# Patient Record
Sex: Female | Born: 1950 | Race: White | Hispanic: No | Marital: Married | Smoking: Former smoker
Health system: Southern US, Community
[De-identification: ages and names within clinical notes are randomized; demographics above are authoritative.]

## PROBLEM LIST (undated history)

## (undated) DIAGNOSIS — T753XXA Motion sickness, initial encounter: Secondary | ICD-10-CM

## (undated) DIAGNOSIS — F32A Depression, unspecified: Secondary | ICD-10-CM

## (undated) DIAGNOSIS — E079 Disorder of thyroid, unspecified: Secondary | ICD-10-CM

## (undated) DIAGNOSIS — F419 Anxiety disorder, unspecified: Secondary | ICD-10-CM

## (undated) DIAGNOSIS — K219 Gastro-esophageal reflux disease without esophagitis: Secondary | ICD-10-CM

## (undated) DIAGNOSIS — F329 Major depressive disorder, single episode, unspecified: Secondary | ICD-10-CM

## (undated) HISTORY — DX: Anxiety disorder, unspecified: F41.9

## (undated) HISTORY — DX: Major depressive disorder, single episode, unspecified: F32.9

## (undated) HISTORY — PX: COLONOSCOPY: SHX174

## (undated) HISTORY — PX: OTHER SURGICAL HISTORY: SHX169

## (undated) HISTORY — PX: VAGINAL HYSTERECTOMY: SUR661

## (undated) HISTORY — PX: BREAST BIOPSY: SHX20

## (undated) HISTORY — PX: ABSCESS DRAINAGE: SHX1119

## (undated) HISTORY — DX: Gastro-esophageal reflux disease without esophagitis: K21.9

## (undated) HISTORY — DX: Depression, unspecified: F32.A

## (undated) HISTORY — DX: Disorder of thyroid, unspecified: E07.9

## (undated) HISTORY — PX: BREAST SURGERY: SHX581

---

## 1976-02-03 HISTORY — PX: THYROID LOBECTOMY: SHX420

## 2004-03-06 ENCOUNTER — Ambulatory Visit: Payer: Self-pay | Admitting: Family Medicine

## 2004-06-24 ENCOUNTER — Ambulatory Visit: Payer: Self-pay | Admitting: Internal Medicine

## 2004-07-04 ENCOUNTER — Ambulatory Visit: Payer: Self-pay | Admitting: Internal Medicine

## 2006-04-09 DIAGNOSIS — K5190 Ulcerative colitis, unspecified, without complications: Secondary | ICD-10-CM | POA: Insufficient documentation

## 2006-04-09 DIAGNOSIS — E010 Iodine-deficiency related diffuse (endemic) goiter: Secondary | ICD-10-CM | POA: Insufficient documentation

## 2006-04-09 DIAGNOSIS — N951 Menopausal and female climacteric states: Secondary | ICD-10-CM | POA: Insufficient documentation

## 2006-04-28 ENCOUNTER — Ambulatory Visit: Payer: Self-pay | Admitting: Family Medicine

## 2006-05-04 ENCOUNTER — Ambulatory Visit: Payer: Self-pay | Admitting: Family Medicine

## 2006-08-27 ENCOUNTER — Ambulatory Visit: Payer: Self-pay | Admitting: Obstetrics and Gynecology

## 2006-09-03 ENCOUNTER — Ambulatory Visit: Payer: Self-pay | Admitting: Obstetrics and Gynecology

## 2008-02-03 HISTORY — PX: THYROIDECTOMY SUBSTERNAL: SUR1359

## 2008-04-09 ENCOUNTER — Encounter: Payer: Self-pay | Admitting: Family Medicine

## 2008-04-13 ENCOUNTER — Ambulatory Visit: Payer: Self-pay | Admitting: Otolaryngology

## 2008-05-03 ENCOUNTER — Encounter: Payer: Self-pay | Admitting: Family Medicine

## 2008-07-05 ENCOUNTER — Ambulatory Visit: Payer: Self-pay | Admitting: Otolaryngology

## 2008-08-14 ENCOUNTER — Inpatient Hospital Stay: Payer: Self-pay | Admitting: Endocrinology

## 2009-07-11 ENCOUNTER — Encounter (INDEPENDENT_AMBULATORY_CARE_PROVIDER_SITE_OTHER): Payer: Self-pay | Admitting: *Deleted

## 2009-10-09 ENCOUNTER — Inpatient Hospital Stay: Payer: Self-pay | Admitting: Endocrinology

## 2010-03-04 NOTE — Letter (Signed)
Summary: Colonoscopy Letter  [facility]  [address] [place], [place] [address]   Phone: [phone]  Fax: [phone]      July 11, 2009 MRN: [id number]   [facility] [address] [place], [place]  [address]   Dear Ms. Devito,   According to your medical record, it is time for you to schedule a Colonoscopy. The American Cancer Society recommends this procedure as a method to detect early colon cancer. Patients with a family history of colon cancer, or a personal history of colon polyps or inflammatory bowel disease are at increased risk.  This letter has beeen generated based on the recommendations made at the time of your procedure. If you feel that in your particular situation this may no longer apply, please contact our office.  Please call our office at [phone] to schedule this appointment or to update your records at your earliest convenience.  Thank you for cooperating with [place] to provide you with the very best care possible.   Sincerely,    Iva Boop, M.D.  [facility] Gastro[facility] [phone]

## 2010-07-01 ENCOUNTER — Encounter: Payer: Self-pay | Admitting: Internal Medicine

## 2010-07-01 ENCOUNTER — Ambulatory Visit (AMBULATORY_SURGERY_CENTER): Payer: BC Managed Care – PPO | Admitting: *Deleted

## 2010-07-01 VITALS — Ht 69.75 in | Wt 219.0 lb

## 2010-07-01 DIAGNOSIS — Z86010 Personal history of colonic polyps: Secondary | ICD-10-CM

## 2010-07-01 MED ORDER — PEG-KCL-NACL-NASULF-NA ASC-C 100 G PO SOLR: ORAL | Status: DC

## 2010-07-15 ENCOUNTER — Other Ambulatory Visit: Payer: Self-pay | Admitting: Internal Medicine

## 2010-07-17 ENCOUNTER — Encounter: Payer: Self-pay | Admitting: Internal Medicine

## 2010-07-17 ENCOUNTER — Ambulatory Visit (AMBULATORY_SURGERY_CENTER): Payer: BC Managed Care – PPO | Admitting: Internal Medicine

## 2010-07-17 VITALS — BP 120/62 | HR 99 | Temp 98.7°F | Resp 18 | Ht 69.0 in | Wt 219.0 lb

## 2010-07-17 DIAGNOSIS — Z1211 Encounter for screening for malignant neoplasm of colon: Secondary | ICD-10-CM

## 2010-07-17 DIAGNOSIS — Z86010 Personal history of colonic polyps: Secondary | ICD-10-CM

## 2010-07-17 MED ORDER — SODIUM CHLORIDE 0.9 % IV SOLN: 500.0000 mL | INTRAVENOUS | Status: AC

## 2010-07-17 NOTE — Progress Notes (Signed)
No complaints on discharge.MAW  Pt assisted to the restroom.  She stayed in there on her own.  I pointed out the call bell red cord to pull if needed.  MAW

## 2010-07-17 NOTE — Patient Instructions (Signed)
Please follow the discharge instructions on the blue and green papers today.  See colonoscopy finding on the [employer] report (picture page).  Please call if any questions or concerns.

## 2010-07-18 ENCOUNTER — Telehealth: Payer: Self-pay | Admitting: *Deleted

## 2010-07-18 NOTE — Telephone Encounter (Signed)

## 2010-10-14 ENCOUNTER — Ambulatory Visit: Payer: Self-pay | Admitting: Family Medicine

## 2010-11-06 ENCOUNTER — Ambulatory Visit: Payer: Self-pay | Admitting: Family Medicine

## 2010-12-01 ENCOUNTER — Ambulatory Visit: Payer: Self-pay | Admitting: General Surgery

## 2010-12-03 LAB — PATHOLOGY REPORT

## 2010-12-04 ENCOUNTER — Ambulatory Visit: Payer: Self-pay | Admitting: General Surgery

## 2012-01-25 ENCOUNTER — Emergency Department: Payer: Self-pay | Admitting: Emergency Medicine

## 2012-02-01 ENCOUNTER — Emergency Department: Payer: Self-pay | Admitting: Emergency Medicine

## 2012-02-11 ENCOUNTER — Ambulatory Visit: Payer: Self-pay | Admitting: Family Medicine

## 2012-02-15 ENCOUNTER — Emergency Department: Payer: Self-pay | Admitting: Emergency Medicine

## 2012-09-06 ENCOUNTER — Ambulatory Visit: Payer: Self-pay | Admitting: Family Medicine

## 2012-10-04 ENCOUNTER — Ambulatory Visit: Payer: Self-pay | Admitting: Family Medicine

## 2012-12-01 ENCOUNTER — Ambulatory Visit: Payer: Self-pay | Admitting: Family Medicine

## 2013-02-27 ENCOUNTER — Emergency Department: Payer: Self-pay | Admitting: Emergency Medicine

## 2013-02-27 LAB — COMPREHENSIVE METABOLIC PANEL
ALT: 34 U/L (ref 12–78)
AST: 24 U/L (ref 15–37)
Albumin: 4.2 g/dL (ref 3.4–5.0)
Alkaline Phosphatase: 109 U/L
Anion Gap: 2 — ABNORMAL LOW (ref 7–16)
BUN: 15 mg/dL (ref 7–18)
Bilirubin,Total: 0.4 mg/dL (ref 0.2–1.0)
CALCIUM: 9.7 mg/dL (ref 8.5–10.1)
CHLORIDE: 106 mmol/L (ref 98–107)
Co2: 32 mmol/L (ref 21–32)
Creatinine: 0.82 mg/dL (ref 0.60–1.30)
EGFR (African American): >60
Glucose: 97 mg/dL (ref 65–99)
OSMOLALITY: 280 (ref 275–301)
Potassium: 3.8 mmol/L (ref 3.5–5.1)
SODIUM: 140 mmol/L (ref 136–145)
TOTAL PROTEIN: 7.9 g/dL (ref 6.4–8.2)

## 2013-02-27 LAB — CBC WITH DIFFERENTIAL/PLATELET
BASOS ABS: 0 10*3/uL (ref 0.0–0.1)
Basophil %: 0.6 %
Eosinophil #: 0.1 10*3/uL (ref 0.0–0.7)
Eosinophil %: 1.7 %
HCT: 41.5 % (ref 35.0–47.0)
HGB: 13.9 g/dL (ref 12.0–16.0)
LYMPHS PCT: 35.2 %
Lymphocyte #: 2.4 10*3/uL (ref 1.0–3.6)
MCH: 30.1 pg (ref 26.0–34.0)
MCHC: 33.5 g/dL (ref 32.0–36.0)
MCV: 90 fL (ref 80–100)
MONO ABS: 0.6 x10 3/mm (ref 0.2–0.9)
MONOS PCT: 8.8 %
NEUTROS PCT: 53.7 %
Neutrophil #: 3.6 10*3/uL (ref 1.4–6.5)
PLATELETS: 343 10*3/uL (ref 150–440)
RBC: 4.63 10*6/uL (ref 3.80–5.20)
RDW: 13.5 % (ref 11.5–14.5)
WBC: 6.8 10*3/uL (ref 3.6–11.0)

## 2013-02-27 LAB — CK TOTAL AND CKMB (NOT AT ARMC)
CK, Total: 55 U/L (ref 21–215)
CK-MB: 0.6 ng/mL (ref 0.5–3.6)

## 2013-02-27 LAB — TROPONIN I: Troponin-I: <0.02 ng/mL

## 2013-02-27 LAB — LIPASE, BLOOD: Lipase: 92 U/L (ref 73–393)

## 2013-02-28 LAB — TROPONIN I

## 2013-05-09 IMAGING — MG MAM DGTL  ADD VW  BIL SCR
2 series · 5 of 5 positions shown · non-contrast
Comparison: none

REASON FOR EXAM: AV BIL LT MASS RT ASYMMETRY
COMMENTS:

[Series 4167: R ML · right · 2 of 2 slices shown]
[im 1/2]
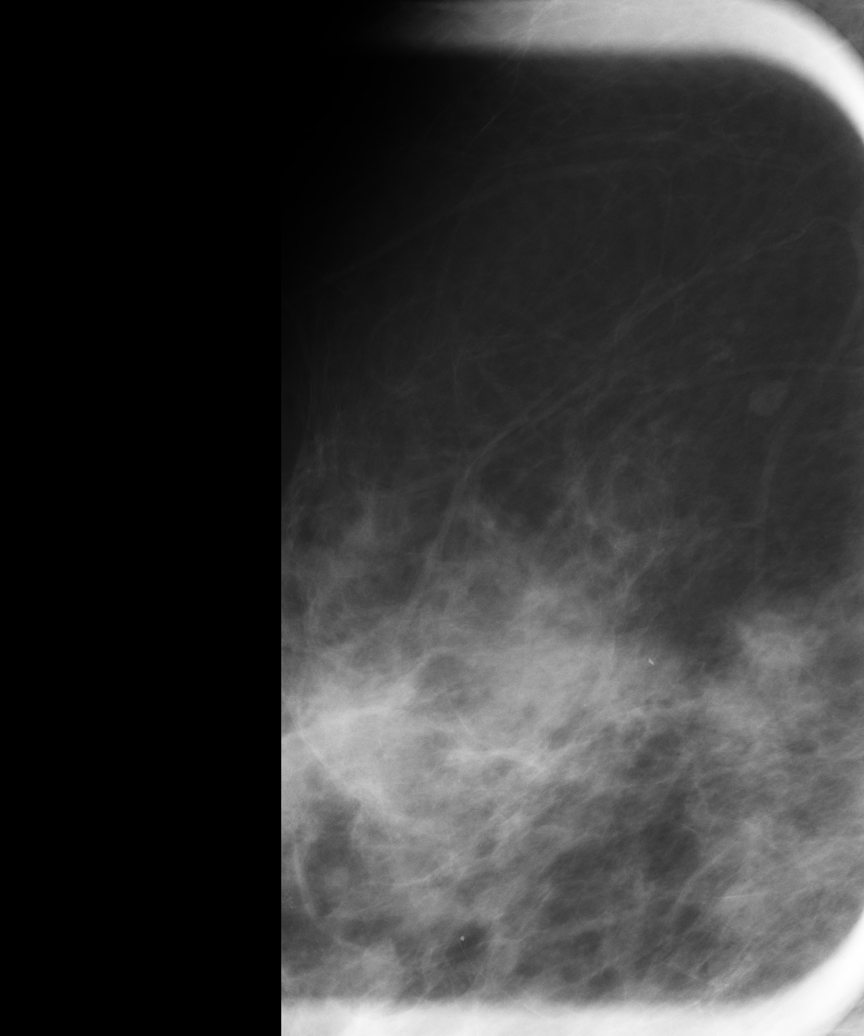
[im 2/2]
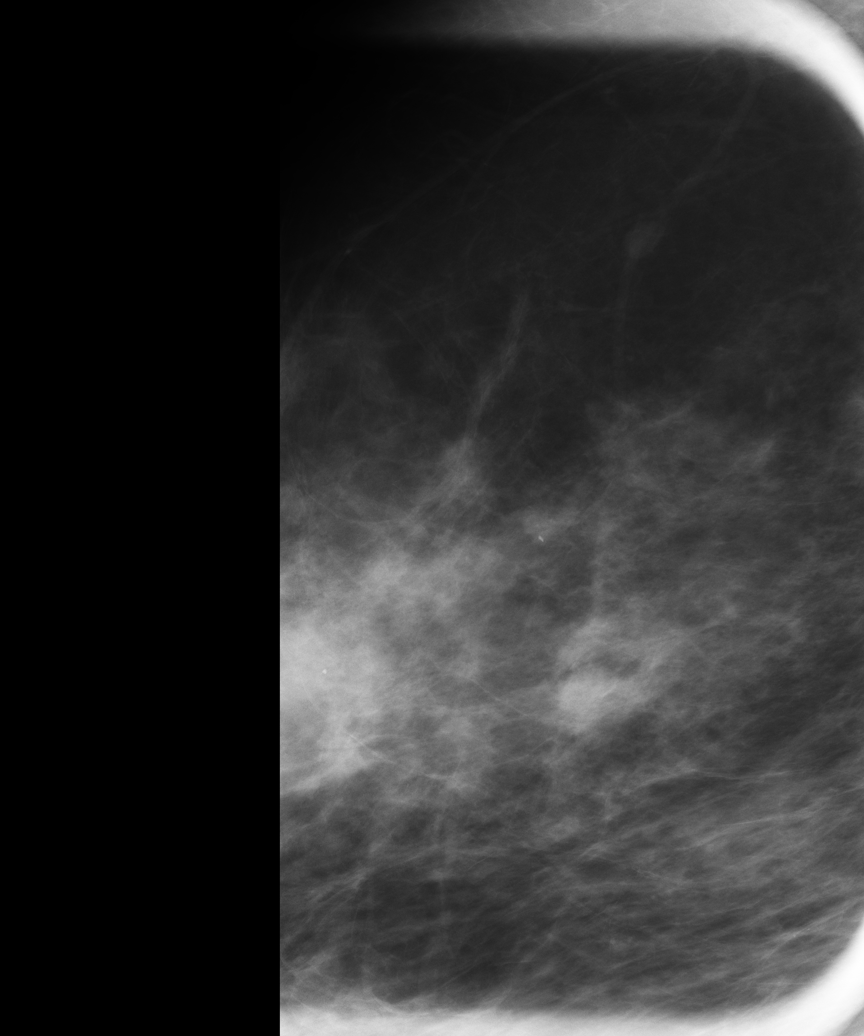

[Series 4170: R CC · right · 3 of 3 slices shown]
[im 1/3]
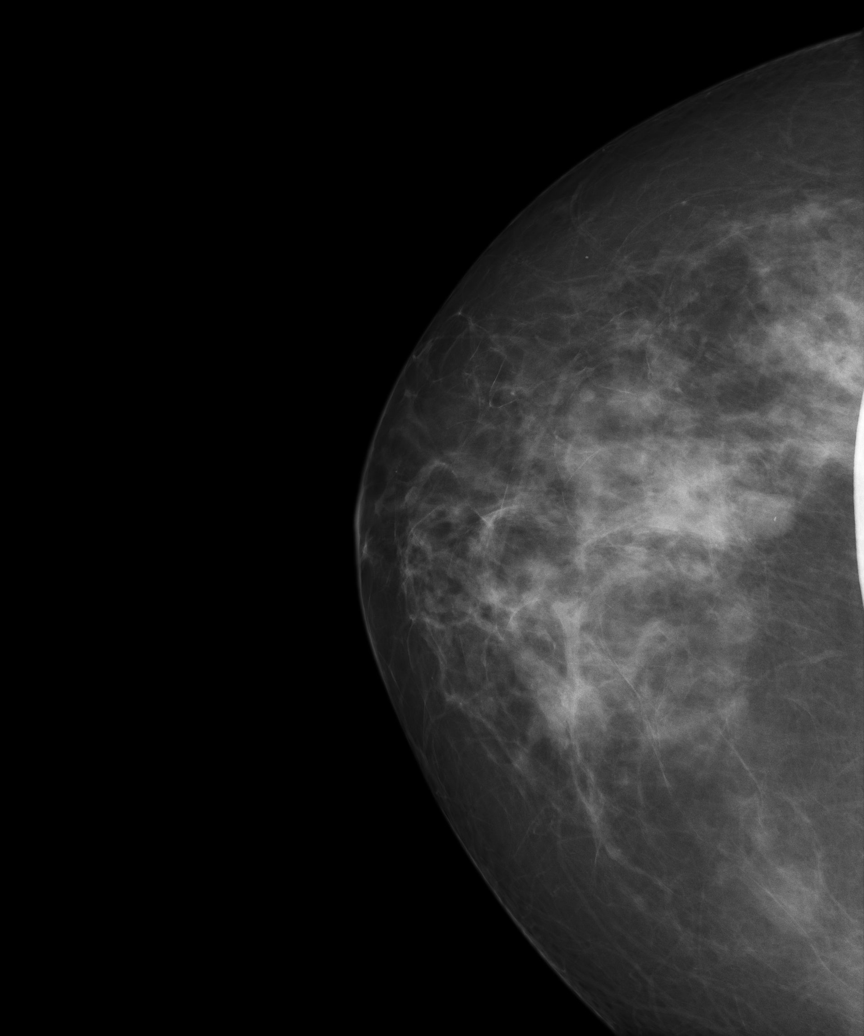
[im 2/3]
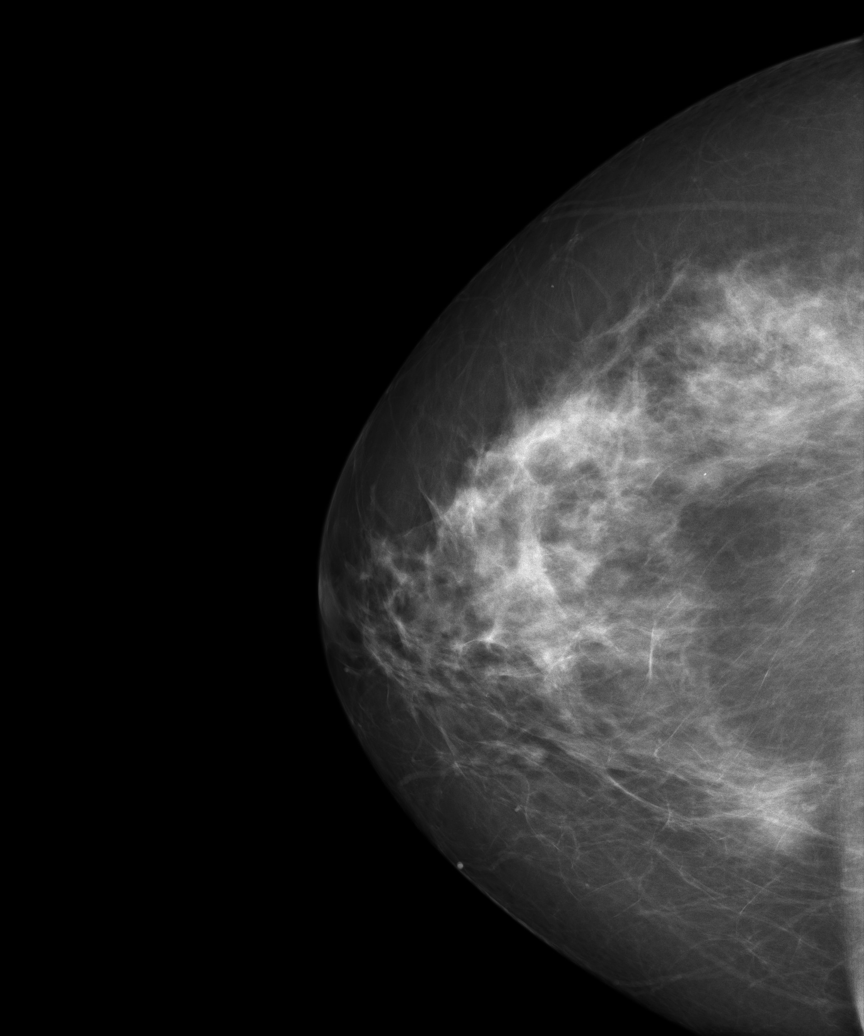
[im 3/3]
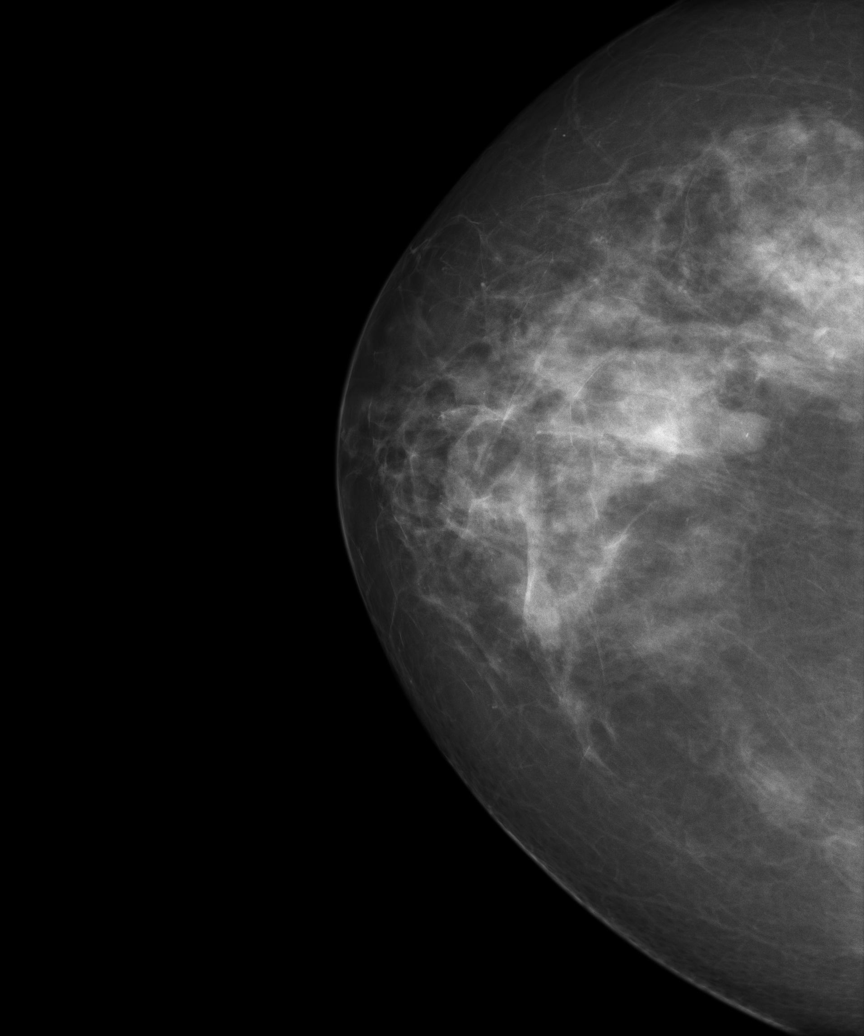

[5 of 5 positions shown; findings below may reference images not displayed]

PROCEDURE:     MAM - MAM DGTL  ADD VW  BIL SCR  - November 06, 2010 [DATE]

RESULT:     The patient returned for additional imaging of an 11 mm mass in
the upper outer quadrant of the left breast. It measures approximately 18 mm
and is located approximately 11 to 12 cm from the nipple at approximately
[DATE]. Spot compression views show the margins to be smooth. No associated
microcalcifications are seen. Targeted left breast ultrasound was performed.
The mass is not identified sonographically even after evaluation was
performed by three separate technologists. Mammographically, this appears to
be a new mass as compared to only prior available view of the left breast
that is dated March 06, 2004. Since a cystic mass would be easily
identified, the finding is felt to represent a solid mass. This could
represent a loculated fat lobule or possibly a benign circumscribed area of
dense fibroglandular tissue. The etiology, however, remains uncertain and in
my opinion histologic evaluation would be required to definitely exclude
malignancy. The finding is thought to be likely accessible by stereotactic
core biopsy if such is selected.

The asymmetry observed in the right breast, is again seen and there is an
apparent corresponding mass observed superiorly in the breast at
approximately 11 o'clock. There is a single central calcification associated
with the mass. Targeted ultrasound was performed and no corresponding
sonographic abnormality is identified. The nature of this mass, therefore,
is indeterminate and percutaneous core biopsy is recommended.
IMPRESSION: 1. There is mass in the upper outer quadrant of the left breast that is
visualized mammographically but not sonographically. Biopsy is recommended
for further evaluation.
2. There is a 9.5 mm mass in the superior portion of the right breast at
approximately [DATE] and which contains a single central calcification. This
mass is located at approximately [DATE], approximately 9.0 cm from the
nipple. This finding also is not identified sonographically and, therefore,
is likely solid. Biopsy of this focus is also recommended.
3. As noted in 1.and 2.above, there are suspicious masses in each breast for
which biopsy is recommended.
4. BIRADS: Category 4A: Suspicios abnormalities.
  ( Likelihood of malignancy at either site is estimated to be up to 10%.)

A negative mammogram report does not preclude biopsy or other evaluation of
a clinically palpable or otherwise suspicious mass or lesion.  Breast cancer
may not be detected by mammography in up to 10% of cases.

## 2013-06-06 DIAGNOSIS — E039 Hypothyroidism, unspecified: Secondary | ICD-10-CM | POA: Insufficient documentation

## 2013-06-06 DIAGNOSIS — F329 Major depressive disorder, single episode, unspecified: Secondary | ICD-10-CM | POA: Insufficient documentation

## 2014-02-27 DIAGNOSIS — C73 Malignant neoplasm of thyroid gland: Secondary | ICD-10-CM | POA: Insufficient documentation

## 2014-06-13 ENCOUNTER — Encounter: Payer: Self-pay | Admitting: Internal Medicine

## 2014-09-07 ENCOUNTER — Telehealth: Payer: Self-pay | Admitting: Family Medicine

## 2014-09-07 NOTE — Telephone Encounter (Signed)
Pt wants to know if you will prescribe the lidocaine patch 5 % .  For joint and muscle pain.  She tried one of someone else and it helped her pain.  She uses [address].

## 2014-09-07 NOTE — Telephone Encounter (Signed)
Patient reports that she will hold off on the lidocaine patch.

## 2014-09-07 NOTE — Telephone Encounter (Signed)
Can try to send in rx. Insurance usually does not pay for it, especially without ov for diagnosis. Usually need PA. Is several hundred dollar a month. Please see if patient still wants to call in rx. Thanks.

## 2014-09-07 NOTE — Telephone Encounter (Signed)
Spoke with pt regarding the note below, pt stated that it hurts on her left hip and buttocks from yesterday being in the car, she also stated that she has a hx of osteoarthritis. Her last ov was on 05/14/2014.

## 2014-09-29 ENCOUNTER — Other Ambulatory Visit: Payer: Self-pay | Admitting: Family Medicine

## 2014-09-29 DIAGNOSIS — M1990 Unspecified osteoarthritis, unspecified site: Secondary | ICD-10-CM

## 2014-10-01 DIAGNOSIS — M1990 Unspecified osteoarthritis, unspecified site: Secondary | ICD-10-CM | POA: Insufficient documentation

## 2014-10-01 NOTE — Telephone Encounter (Signed)
Last OV 05/2014  (Saw Tawanna Sat)  Thanks,   -Mickel Baas

## 2014-10-11 ENCOUNTER — Other Ambulatory Visit: Payer: Self-pay | Admitting: Family Medicine

## 2014-10-11 DIAGNOSIS — K219 Gastro-esophageal reflux disease without esophagitis: Secondary | ICD-10-CM

## 2014-10-12 NOTE — Telephone Encounter (Signed)
Last ov was on 05/14/2014. Last time this prescription was refilled was on 04/13/2014.  Thanks,

## 2014-10-22 ENCOUNTER — Encounter: Payer: Self-pay | Admitting: Family Medicine

## 2014-10-22 ENCOUNTER — Ambulatory Visit (INDEPENDENT_AMBULATORY_CARE_PROVIDER_SITE_OTHER): Payer: 59 | Admitting: Family Medicine

## 2014-10-22 VITALS — BP 120/70 | HR 96 | Temp 98.1°F | Resp 16 | Ht >= 80 in | Wt 183.0 lb

## 2014-10-22 DIAGNOSIS — S61459A Open bite of unspecified hand, initial encounter: Secondary | ICD-10-CM | POA: Insufficient documentation

## 2014-10-22 DIAGNOSIS — L989 Disorder of the skin and subcutaneous tissue, unspecified: Secondary | ICD-10-CM | POA: Diagnosis not present

## 2014-10-22 DIAGNOSIS — Z23 Encounter for immunization: Secondary | ICD-10-CM

## 2014-10-22 DIAGNOSIS — K529 Noninfective gastroenteritis and colitis, unspecified: Secondary | ICD-10-CM | POA: Insufficient documentation

## 2014-10-22 DIAGNOSIS — Z8701 Personal history of pneumonia (recurrent): Secondary | ICD-10-CM | POA: Insufficient documentation

## 2014-10-22 DIAGNOSIS — C73 Malignant neoplasm of thyroid gland: Secondary | ICD-10-CM | POA: Insufficient documentation

## 2014-10-22 DIAGNOSIS — J309 Allergic rhinitis, unspecified: Secondary | ICD-10-CM | POA: Insufficient documentation

## 2014-10-22 DIAGNOSIS — M25511 Pain in right shoulder: Secondary | ICD-10-CM | POA: Diagnosis not present

## 2014-10-22 DIAGNOSIS — M25519 Pain in unspecified shoulder: Secondary | ICD-10-CM | POA: Insufficient documentation

## 2014-10-22 DIAGNOSIS — R52 Pain, unspecified: Secondary | ICD-10-CM | POA: Insufficient documentation

## 2014-10-22 DIAGNOSIS — R413 Other amnesia: Secondary | ICD-10-CM | POA: Diagnosis not present

## 2014-10-22 DIAGNOSIS — H60549 Acute eczematoid otitis externa, unspecified ear: Secondary | ICD-10-CM | POA: Insufficient documentation

## 2014-10-22 DIAGNOSIS — H6980 Other specified disorders of Eustachian tube, unspecified ear: Secondary | ICD-10-CM | POA: Insufficient documentation

## 2014-10-22 NOTE — Progress Notes (Signed)
Patient ID: Jamie Baxter, female   DOB: 30-Oct-1950, 64 y.o.   MRN: [id number]        Patient: Jamie Baxter Female    DOB: Feb 27, 1950   64 y.o.   MRN: [id number] Visit Date: 10/22/2014  Today's Provider: Margarita Rana, MD   Chief Complaint  Patient presents with  . Hand Pain   Subjective:    HPI   Lump on right hand  Patient reports that she has noticed a lump on her right hand. Patient denies pain, or change is size. Patient reports that movement of wrist makes it a little uncomfortable. Patient reports that she had thumb surgery on 12/05/2013 and has noticed the lump since then.  Had surgery about at year ago and needs referral.   Joint/Muscle Pain:  Patient reports that she is having muscle pain in her right arm around her deltoid and forearm. Patient reports that this has being going on since the spring. Patient denies injury to arm and does not think it is related to her wrist pain. Patient reports that she has not tried any OTC medications for her muscle pain. Not the joint. Is right handed. No trauma.    Memory loss: Patient reports that she is needing a referral to see Dr. Fernande Bras at [facility] neurological.  Patient reports that her physiatrist is recommending she get a CT scan of her head for memory loss and refeprral as noted. Memory has been worsening since hospitalization.   ------------------------------------------------------------------------------------       Allergies  Allergen Reactions  . Amantadine Other (See Comments)    disorientation  . Benztropine   . Geodon  [Ziprasidone Hcl] Other (See Comments)    disorientation  . Sulfa Antibiotics    Previous Medications   ARIPIPRAZOLE (ABILIFY) 2 MG TABLET    Take 2 mg by mouth at bedtime.     DESVENLAFAXINE (PRISTIQ) 100 MG 24 HR TABLET    Take 100 mg by mouth daily.     DEXILANT 60 MG CAPSULE    TAKE ONE CAPSULE BY MOUTH ONCE DAILY   DIAZEPAM (VALIUM) 5 MG TABLET    Take 5 mg by mouth 2 (two) times  daily. Takes 1 in the morning and 2 at bedtime    ETODOLAC (LODINE) 400 MG TABLET    TAKE ONE TABLET BY MOUTH TWICE DAILY AS NEEDED (STOP MOBIC AND ALEVE)   FLUTICASONE (FLONASE) 50 MCG/ACT NASAL SPRAY       LAMOTRIGINE (LAMICTAL) 150 MG TABLET       LORATADINE (CLARITIN REDITABS) 10 MG DISSOLVABLE TABLET    Take by mouth.   SEROQUEL XR 50 MG TB24 24 HR TABLET       THYROID (ARMOUR) 180 MG TABLET    Take 180 mg by mouth daily.     TRAZODONE (DESYREL) 50 MG TABLET    Take by mouth.   VIIBRYD 40 MG TABS        Review of Systems  Social History  Substance Use Topics  . Smoking status: Former Research scientist (life sciences)  . Smokeless tobacco: Never Used  . Alcohol Use: No   Objective:   BP 120/70 mmHg  Pulse 96  Temp(Src) 98.1 F (36.7 C) (Oral)  Resp 16  Ht 8' (2.438 m)  Wt 183 lb (83.008 kg)  BMI 13.97 kg/m2  SpO2 97%  Physical Exam  Constitutional: She is oriented to person, place, and time. She appears well-developed and well-nourished.  Musculoskeletal: Normal range of motion. She exhibits tenderness.  Tender  in right biceps and deltoid. Strength intact.  Also with large cyst type structure, mildly tender below right MCP  joint.    Neurological: She is alert and oriented to person, place, and time.       Assessment & Plan:     1. Need for influenza vaccination Given today.  - Flu Vaccine QUAD 36+ mos IM  2. Memory loss Worsening. Will refer per her psychiatrists request.  - Ambulatory referral to Neurology  3. Hand lesion Worsening. Will refer back to her surgeon.  - Ambulatory referral to Orthopedic Surgery  4. Right shoulder pain New problem.  Worsening. Will refer to evaluate and treat.  Already on anti-inflammatory.   - Ambulatory referral to [place], MD  [facility]

## 2014-12-03 ENCOUNTER — Ambulatory Visit (INDEPENDENT_AMBULATORY_CARE_PROVIDER_SITE_OTHER): Payer: 59 | Admitting: Neurology

## 2014-12-03 ENCOUNTER — Encounter: Payer: Self-pay | Admitting: Neurology

## 2014-12-03 VITALS — BP 150/82 | HR 92 | Resp 20 | Ht 70.0 in | Wt 180.0 lb

## 2014-12-03 DIAGNOSIS — G4711 Idiopathic hypersomnia with long sleep time: Secondary | ICD-10-CM | POA: Diagnosis not present

## 2014-12-03 DIAGNOSIS — G4754 Parasomnia in conditions classified elsewhere: Secondary | ICD-10-CM

## 2014-12-03 NOTE — Progress Notes (Signed)
SLEEP MEDICINE CLINIC   Provider:  Larey Seat, M D  Referring Provider: Margarita Rana, MD Primary Care Physician:  Margarita Rana, MD  Chief Complaint  Patient presents with  . New Patient (Initial Visit)    memory, rm 11, with sister    HPI:  Jamie Baxter is a 64 y.o. female , seen here as a referral from Dr. Venia Minks for memory concerns.    Chief complaint according to patient : " I think my words don't come easily and I feel anxious about it "  About a year ago the patient was hospitalized for anxiety and depression. She has noted difficulties with word finding and word generation but also with the process of understanding and deciphering verbal messages. She finds it easier to read and extract the meaning than to lessen. There may be an auditory processing difficulty. She is easily in tears, and she feels fatigued.  The patient was tested today aced on these complaints with a [place] cognitive assessment test. She scored 30 out of 30 points, her F word fluency was 23, she was well able to perform visual spatial tasks executive tasks, naming, recall memory abstraction, language attention and orientation. Listening to Mrs. Blasingame, she seems to feel easily fatigable as well as distractible. She used the word  forgetfulness with these qualifiers. She has gotten lost driving, she has been sleepy, and she stopped working. 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  How likely are you to doze in the following situations: 0 = not likely, 1 = slight chance, 2 = moderate chance, 3 = high chance  Sitting and Reading? 3 Watching Television? 1 Sitting inactive in a public place (theater or meeting)? 0 Lying down in the afternoon when circumstances permit? 3 Sitting and talking to someone?1 Sitting quietly after lunch without alcohol?3 In a car, while stopped for a few  minutes in traffic? 0 As a passenger in a car for an hour without a break? 1  Total =12   Sleep habits are as follows: The patient has been using sleep aids for several months.  Often she fell asleep in her recliner before going to bed. Her usual bedtime is around 9:30 PM, and this medication she can initiate sleep with an an hour to 90 minutes. She reports she had no trouble falling asleep until her hospitalization. May 2015. Once she is asleep , and often restless- she gets up again in the middle of the night and takes Seroquel and trazodone. Sleep talking is not reported.  Overall sleep time is about 8-9 hours of sleep . She is married, but sleeps alone.  She has woken herself snoring. Bedroom is cool, quiet and dark.  Some mornings she sets the alarm, ususually she is awake by 7 AM. Sleep medical history and family sleep history: sister with insomnia. Sleeps 3 hours nightly.  Social history:  Married, here with sister.  No ETOH use and no nicotine . Mother has hypersomnia   Review of Systems: Out of a complete 14 system review, the patient complains of only the following symptoms, and all other reviewed systems are negative.   Epworth score 12 , Fatigue severity score n/a   , depression score see below.    Social History   Social History  . Marital Status: Married    Spouse Name: N/A  . Number of Children: N/A  . Years  of Education: N/A   Occupational History  . Not on file.   Social History Main Topics  . Smoking status: Former Research scientist (life sciences)  . Smokeless tobacco: Never Used  . Alcohol Use: No  . Drug Use: No  . Sexual Activity: Not on file   Other Topics Concern  . Not on file   Social History Narrative    History reviewed. No pertinent family history.  Past Medical History  Diagnosis Date  . Depression   . Thyroid disease   . GERD (gastroesophageal reflux disease)     Past Surgical History  Procedure Laterality Date  . Thyroid lobectomy  1978    goiter  .  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 Vaginal hysterectomy      fiboid tumors  . Colonoscopy  2006, 07/17/10    Normal (except melanosis), ? hx polyps in 1990's - [place]    Current Outpatient Prescriptions  Medication Sig Dispense Refill  . desvenlafaxine (PRISTIQ) 100 MG 24 hr tablet Take 100 mg by mouth daily.      Marland Kitchen DEXILANT 60 MG capsule TAKE ONE CAPSULE BY MOUTH ONCE DAILY 30 capsule 4  . etodolac (LODINE) 400 MG tablet TAKE ONE TABLET BY MOUTH TWICE DAILY AS NEEDED (STOP MOBIC AND ALEVE) 60 tablet 3  . fluticasone (FLONASE) 50 MCG/ACT nasal spray   1  . lamoTRIgine (LAMICTAL) 150 MG tablet   2  . loratadine (CLARITIN REDITABS) 10 MG dissolvable tablet Take by mouth.    . SEROQUEL XR 50 MG TB24 24 hr tablet   9  . thyroid (ARMOUR) 180 MG tablet Take 180 mg by mouth daily.      . traZODone (DESYREL) 50 MG tablet Take by mouth.    Marland Kitchen VIIBRYD 40 MG TABS   10   Current Facility-Administered Medications  Medication Dose Route Frequency Provider Last Rate Last Dose  . 0.9 %  sodium chloride infusion  500 mL Intravenous Continuous Gatha Mayer, MD        Allergies as of 12/03/2014 - Review Complete 12/03/2014  Allergen Reaction Noted  . Amantadine Other (See Comments) 10/22/2014  . Benztropine  10/22/2014  . Geodon  [ziprasidone hcl] Other (See Comments) 10/22/2014  . Sulfa antibiotics  07/17/2010    Vitals: BP 150/82 mmHg  Pulse 92  Resp 20  Ht 5\' 10"  (1.778 m)  Wt 180 lb (81.647 kg)  BMI 25.83 kg/m2 Last Weight:  Wt Readings from Last 1 Encounters:  12/03/14 180 lb (81.647 kg)   KYH:CWCB mass index is 25.83 kg/(m^2).     Last Height:   Ht Readings from Last 1 Encounters:  12/03/14 5\' 10"  (1.778 m)    Physical exam:  General: The patient is awake, alert and appears not in acute distress. The patient is well groomed. Head: Normocephalic, atraumatic. Neck is supple. Mallampati 3,  neck circumference:  15.5 . Nasal airflow ,  Retrognathia is not seen.  Cardiovascular:  Regular rate and rhythm , without  murmurs or carotid bruit, and without distended neck veins. Respiratory: Lungs are clear to auscultation. Skin:  Without evidence of edema, or rash Trunk: BMI is normal . The patient's posture is erect  Neurologic exam : The patient is awake and alert, oriented to place and time.   Memory subjective  described as impaired, [place]   MOCA: Montreal Cognitive Assessment  12/03/2014  Visuospatial/ Executive (0/5) 5  Naming (0/3) 3  Attention: Read list of digits (0/2) 2  Attention: Read list of letters (0/1) 1  Attention: Serial 7 subtraction starting at 100 (0/3) 3  Language: Repeat phrase (0/2) 2  Language : Fluency (0/1) 1  Abstraction (0/2) 2  Delayed Recall (0/5) 5  Orientation (0/6) 6  Total 30  Adjusted Score (based on education) 30   MMSE:No flowsheet data found.     Attention span & concentration ability appears normal.  Speech is fluent,  Without  dysarthria, dysphonia or aphasia.  Mood and affect are appropriate.  Cranial nerves: Pupils are equal and briskly reactive to light. Funduscopic exam without evidence of pallor or edema. Extraocular movements  in vertical and horizontal planes intact and without nystagmus. Visual fields by finger perimetry are intact. Hearing to finger rub intact. Facial sensation intact to fine touch. Facial motor strength is symmetric and tongue and uvula move midline. Shoulder shrug was symmetrical.   Motor exam:  Normal tone, muscle bulk and symmetric strength in all extremities. Sensory:  Fine touch, pinprick and vibration were tested in all extremities.  Proprioception tested in the upper extremities was normal. Coordination: Rapid alternating movements in the fingers/hands was normal. Finger-to-nose maneuver  normal without evidence of ataxia, dysmetria or tremor. Gait and station: Patient walks without assistive device and is able  unassisted to climb up to the exam table. Strength within normal limits.  Stance is stable and normal.  Toe and heel stand were tested .Tandem gait is unfragmented. . Romberg testing is  negative.  Deep tendon reflexes: in the  upper and lower extremities are symmetric and intact. Babinski maneuver response is downgoing.  The patient was advised of the nature of the diagnosed sleep disorder , the treatment options and risks for general a health and wellness arising from not treating the condition.  I spent more than 45 minutes of face to face time with the patient. Greater than 50% of time was spent in counseling and coordination of care. We have discussed the diagnosis and differential and I answered the patient's questions.     Assessment:  After physical and neurologic examination, review of laboratory studies,  Personal review of imaging studies, reports of other /same  Imaging studies ,  Results of polysomnography/ neurophysiology testing and pre-existing records as far as provided in visit., my assessment is   1) insomnia at sleep onset with normal sleep duration. Onset with the dismissal from work. Traumatic event.  Psychological stressors and habituation of sleep aids have caused chronic insomnia   2) given the patient's gradual history of evolving insomnia and the corresponding degree of fatigue and daytime soreness, achiness malaise I think it would be best to streamline her medication treatment. It may also help to you some biofeedback and meditation techniques to allow her to initiate sleep. Sleep hygiene will play a big role as well. I suggest that Dr. Toy Care will increase the Seroquel 200 or even 150 mg nightly as the main sleep inducer. Seroquel also reduces the REM sleep activity at night so nightmares or disturbing vivid dreams would not be as likely to interrupt her sleep.   3)  Pseudo memory loss ; Underlying condition is anxiety and depression. MOCA 30-30 points.    Plan:   Treatment plan and additional workup :  The patient also reports that she is snoring and sometimes wakes up with gasping or a feeling of shortness of air, she has scary and vivid dreams also this change after hospitalization and medication changes. She  does not report any restless legs. To rule out an organic component to her sleep disorder I will order a polysomnography. She does not report morning headaches nor sleep attacks or headaches waking her from sleep. We will not need to perform a couple Percell Miller but it would be best to have a close eye on hypoxemia. She is not able to wean of several REM Supressant medications. NO MSLT to follow.   PSG and SPLIT ,  Letter to Dr Toy Care, increase seroquel and reduce benzodiazepine.    Asencion Partridge Akima Slaugh MD  12/03/2014   CC: Margarita Rana, Md [address] [place], [place] [address]

## 2014-12-03 NOTE — Patient Instructions (Signed)
The patient also reports that she is snoring and sometimes wakes up with gasping or a feeling of shortness of air, she has scary and vivid dreams also this change after hospitalization and medication changes. She does not report any restless legs. To rule out an organic component to her sleep disorder I will order a polysomnography. She does not report morning headaches nor sleep attacks or headaches waking her from sleep. We will not need to perform a couple Percell Miller but it would be best to have a close eye on hypoxemia. She is not able to wean of several REM Supressant medications. NO MSLT to follow.   PSG and SPLIT ,  Letter to Dr Toy Care, increase seroquel and reduce benzodiazepine.  Sleep Apnea Sleep apnea is disorder that affects a person's sleep. A person with sleep apnea has abnormal pauses in their breathing when they sleep. It is hard for them to get a good sleep. This makes a person tired during the day. It also can lead to other physical problems. There are three types of sleep apnea. One type is when breathing stops for a short time because your airway is blocked (obstructive sleep apnea). Another type is when the brain sometimes fails to give the normal signal to breathe to the muscles that control your breathing (central sleep apnea). The third type is a combination of the other two types. HOME CARE  Do not sleep on your back. Try to sleep on your side.  Take all medicine as told by your doctor.  Avoid alcohol, calming medicines (sedatives), and depressant drugs.  Try to lose weight if you are overweight. Talk to your doctor about a healthy weight goal. Your doctor may have you use a device that helps to open your airway. It can help you get the air that you need. It is called a positive airway pressure (PAP) device. There are three types of PAP devices:  Continuous positive airway pressure (CPAP) device.  Nasal expiratory positive airway pressure (EPAP) device.  Bilevel positive  airway pressure (BPAP) device. MAKE SURE YOU:  Understand these instructions.  Will watch your condition.  Will get help right away if you are not doing well or get worse.   This information is not intended to replace advice given to you by your health care provider. Make sure you discuss any questions you have with your health care provider.   Document Released: 10/29/2007 Document Revised: 02/09/2014 Document Reviewed: 05/23/2011 Elsevier Interactive Patient Education [employer].

## 2014-12-25 ENCOUNTER — Other Ambulatory Visit: Payer: Self-pay | Admitting: Obstetrics and Gynecology

## 2014-12-25 DIAGNOSIS — Z1231 Encounter for screening mammogram for malignant neoplasm of breast: Secondary | ICD-10-CM

## 2014-12-25 DIAGNOSIS — Z13820 Encounter for screening for osteoporosis: Secondary | ICD-10-CM

## 2014-12-31 ENCOUNTER — Telehealth: Payer: Self-pay | Admitting: Family Medicine

## 2014-12-31 MED ORDER — PANTOPRAZOLE SODIUM 40 MG PO TBEC: 40.0000 mg | DELAYED_RELEASE_TABLET | Freq: Every day | ORAL | Status: DC

## 2014-12-31 NOTE — Telephone Encounter (Signed)
Pt contacted office for refill request on the following medications:  Pt is requesting a different Rx other than DEXILANT 60 MG capsule.  Pt states this is $80.00 for a 30 day supply.  [place]  HX:[id number]

## 2015-01-01 ENCOUNTER — Telehealth: Payer: Self-pay | Admitting: Neurology

## 2015-01-01 NOTE — Telephone Encounter (Signed)
[employer] denied Split sleep study.  Can I get an order for HST?

## 2015-01-08 ENCOUNTER — Encounter (INDEPENDENT_AMBULATORY_CARE_PROVIDER_SITE_OTHER): Payer: 59 | Admitting: Neurology

## 2015-01-08 DIAGNOSIS — G4710 Hypersomnia, unspecified: Secondary | ICD-10-CM | POA: Diagnosis not present

## 2015-01-15 ENCOUNTER — Telehealth: Payer: Self-pay

## 2015-01-15 DIAGNOSIS — R0902 Hypoxemia: Secondary | ICD-10-CM

## 2015-01-15 DIAGNOSIS — G4733 Obstructive sleep apnea (adult) (pediatric): Secondary | ICD-10-CM

## 2015-01-15 NOTE — Telephone Encounter (Signed)
Called pt to discuss her sleep study results, no answer, left a message asking her to call me back.

## 2015-01-16 ENCOUNTER — Telehealth: Payer: Self-pay | Admitting: Neurology

## 2015-01-16 NOTE — Telephone Encounter (Signed)
CPAP titration denied please order auto pap

## 2015-01-16 NOTE — Telephone Encounter (Signed)
Spoke to pt regarding sleep study results. I advised her that her stud revealed hypoxemia with mild osa and treatment is advised. I advised her that PAP therapy is indicated because dental devices do not treat hypoxemia. Dr. Brett Fairy recommends proceeding with a titration study. Pt is agreeable to proceeding with a titration study as long as her insurance will cover it. I advised her that I would send the order to the sleep lab and they would be calling her to schedule it if her insurance will pay for it. Pt verbalized understanding.

## 2015-01-16 NOTE — Telephone Encounter (Signed)
Patient is returning a call. °

## 2015-01-17 ENCOUNTER — Telehealth: Payer: Self-pay | Admitting: Neurology

## 2015-01-17 DIAGNOSIS — G4710 Hypersomnia, unspecified: Secondary | ICD-10-CM

## 2015-01-17 DIAGNOSIS — R0683 Snoring: Secondary | ICD-10-CM

## 2015-01-17 NOTE — Telephone Encounter (Signed)
Called to advise pt that her CPAP titration was denied. Her insurance has recommended her to start an auto CPAP. I was going to explain this to the pt and offer her an appt with Dr. Brett Fairy to discuss this before starting. Pt's husband answered the phone and I asked him to have the pt call me back.

## 2015-01-17 NOTE — Telephone Encounter (Signed)
HST already performed, no need to perform another.

## 2015-01-17 NOTE — Telephone Encounter (Signed)
Ins denied split sleep study. Can I get an order for HST?

## 2015-01-21 NOTE — Addendum Note (Signed)
Addended by: Lester Muenster A on: 01/21/2015 04:10 PM   Modules accepted: Orders

## 2015-01-21 NOTE — Telephone Encounter (Signed)
Spoke to pt and advised her that her cpap titration was denied. Pt says she thought this was the case. I explained that her insurance recommended an auto cpap. I explained that Dr. Brett Fairy would like her to try this auto cpap for 30-60 days and bring her in for an appt to discuss the results of an auto cpap trial. Pt states that she cannot afford this at this time. She does not want to try an auto cpap. She says she "can't afford any of it". She also refused a f/u appt with Dr. Brett Fairy. I advised her that if she changed her mind to please call us. Pt verbalized understanding.

## 2015-01-28 ENCOUNTER — Other Ambulatory Visit: Payer: Self-pay | Admitting: Family Medicine

## 2015-01-28 DIAGNOSIS — M1990 Unspecified osteoarthritis, unspecified site: Secondary | ICD-10-CM

## 2015-02-18 ENCOUNTER — Telehealth: Payer: Self-pay | Admitting: Family Medicine

## 2015-02-18 NOTE — Telephone Encounter (Signed)
Pt called wanting to let you know that she can use the dexilant if her pharmacy sends for a refill.  Any questions please call her at  [phone]  Thanks Con Memos

## 2015-02-18 NOTE — Telephone Encounter (Signed)
LMTCB 02/18/2015  Thanks,   -Mickel Baas

## 2015-03-04 ENCOUNTER — Ambulatory Visit (INDEPENDENT_AMBULATORY_CARE_PROVIDER_SITE_OTHER): Payer: Self-pay | Admitting: Family Medicine

## 2015-03-04 ENCOUNTER — Ambulatory Visit
Admission: RE | Admit: 2015-03-04 | Discharge: 2015-03-04 | Disposition: A | Discharge status: Home / Self Care | Payer: BLUE CROSS/BLUE SHIELD | Source: Ambulatory Visit | Attending: Family Medicine | Admitting: Family Medicine

## 2015-03-04 ENCOUNTER — Encounter: Payer: Self-pay | Admitting: Family Medicine

## 2015-03-04 VITALS — BP 118/78 | HR 90 | Temp 98.4°F | Resp 16 | Wt 190.6 lb

## 2015-03-04 DIAGNOSIS — R05 Cough: Secondary | ICD-10-CM | POA: Diagnosis present

## 2015-03-04 DIAGNOSIS — J180 Bronchopneumonia, unspecified organism: Secondary | ICD-10-CM

## 2015-03-04 MED ORDER — AMOXICILLIN-POT CLAVULANATE 875-125 MG PO TABS: 1.0000 | ORAL_TABLET | Freq: Two times a day (BID) | ORAL | Status: DC

## 2015-03-04 MED ORDER — HYDROCODONE-HOMATROPINE 5-1.5 MG/5ML PO SYRP: 5.0000 mL | ORAL_SOLUTION | Freq: Three times a day (TID) | ORAL | Status: DC | PRN

## 2015-03-04 NOTE — Progress Notes (Signed)
Patient ID: Jamie Baxter, female   DOB: 10-29-1950, 65 y.o.   MRN: [id number]   Patient: Jamie Baxter Female    DOB: 1950-10-11   65 y.o.   MRN: [id number] Visit Date: 03/04/2015  Today's Provider: Vernie Murders, PA   Chief Complaint  Patient presents with  . URI   Subjective:    URI  This is a new problem. The current episode started 1 to 4 weeks ago. The problem has been gradually worsening. There has been no fever. Associated symptoms include congestion, coughing, diarrhea and wheezing. Pertinent negatives include no sinus pain, sneezing or sore throat. Associated symptoms comments: Cough is ticklish and worse at night when she lies down.. Treatments tried: cough drops/lozenges. The treatment provided no relief.   Patient Active Problem List   Diagnosis Date Noted  . Allergic rhinitis 10/22/2014  . Body aches 10/22/2014  . Open bite of hand 10/22/2014  . 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 Dysfunction of eustachian tube 10/22/2014  . Gastroenteritis, non-infectious 10/22/2014  . H/O recurrent pneumonia 10/22/2014  . 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 Memory loss 10/22/2014  . Hand lesion 10/22/2014  . Shoulder pain 10/22/2014  . Osteoarthritis 10/01/2014  . Cancer of thyroid ([place]) 02/27/2014  . Malignant neoplasm of thyroid gland ([place]) 02/27/2014  . Adult hypothyroidism 06/06/2013  . 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 Major depressive disorder ([place]) 06/06/2013  . 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 Climacteric 04/09/2006  . Esophagitis, reflux 04/09/2006  . Colitis gravis ([place]) 04/09/2006  . Ulcerative colitis ([place]) 04/09/2006   Past Surgical History  Procedure Laterality Date  . Thyroid lobectomy  1978    goiter  . 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 Vaginal hysterectomy      fiboid tumors  . Colonoscopy  2006, 07/17/10    Normal (except melanosis), ? hx polyps in 1990's - [place]   . Breast surgery      breast biopsy   Family History  Problem Relation Age of Onset  . Hypertension Mother   . GER disease Father   . Rheum arthritis Sister   . Healthy Sister      Previous Medications   DESVENLAFAXINE (PRISTIQ) 100 MG 24 HR TABLET    Take 100 mg by mouth daily.     ETODOLAC (LODINE) 400 MG TABLET    TAKE ONE TABLET BY MOUTH TWICE DAILY AS NEEDED *STOP MOBIC AND ALEVE*   FLUTICASONE (FLONASE) 50 MCG/ACT NASAL SPRAY       LAMOTRIGINE (LAMICTAL) 150 MG TABLET       LORATADINE (CLARITIN REDITABS) 10 MG DISSOLVABLE TABLET    Take by mouth.   PANTOPRAZOLE (PROTONIX) 40 MG TABLET    Take 1 tablet (40 mg total) by mouth daily.   SEROQUEL XR 50 MG TB24 24 HR TABLET       THYROID (ARMOUR) 180 MG TABLET    Take 180 mg by mouth daily.     TRAZODONE (DESYREL) 50 MG TABLET    Take by mouth.   VIIBRYD 40 MG TABS       Allergies  Allergen Reactions  . Amantadine Other (See Comments)    disorientation  . Benztropine   . Geodon  [Ziprasidone Hcl] Other (See Comments)    disorientation  . Sulfa Antibiotics     Review of Systems  Constitutional: Negative.   HENT: Positive for congestion. Negative for  sneezing and sore throat.   Eyes: Negative.   Respiratory: Positive for cough and wheezing.   Cardiovascular: Negative.   Gastrointestinal: Positive for diarrhea.  Endocrine: Negative.   Genitourinary: Negative.   Musculoskeletal: Negative.   Skin: Negative.   Allergic/Immunologic: Negative.   Neurological: Negative.   Hematological: Negative.   Psychiatric/Behavioral: Negative.     Social History  Substance Use Topics  . Smoking status: Former Research scientist (life sciences)  . Smokeless tobacco: Never Used  . Alcohol Use: No   Objective:   BP 118/78 mmHg  Pulse 90  Temp(Src) 98.4 F (36.9 C) (Oral)  Resp 16  Wt 190 lb 9.6 oz (86.456 kg)  SpO2 98%  Physical Exam  Constitutional: She is oriented to person, place, and time. She appears well-developed and well-nourished.  HENT:    Head: Normocephalic.  Right Ear: External ear normal.  Left Ear: External ear normal.  Nose: Nose normal.  Mouth/Throat: Oropharynx is clear and moist.  Slight cobblestone appearance to posterior pharynx  Neck: Normal range of motion. Neck supple.  Cardiovascular: Normal rate, regular rhythm and normal heart sounds.   Pulmonary/Chest: Effort normal.  Bubbly rhonchi/rales in right mid chest.  Abdominal: Soft. Bowel sounds are normal.  Lymphadenopathy:    She has no cervical adenopathy.  Neurological: She is alert and oriented to person, place, and time.  Skin: Skin is warm and dry.      Assessment & Plan:     1. Bronchopneumonia Started having cough and fever sensation (no elevation of temperature documented) over the past 2-3 days. Had cough and congestion last week and felt better for a day. This return of symptoms seems to be worse and can hear bubbly rales/rhonchi with auscultation of right upper chest. Will get CXR and start cough syrup with antibiotic. Encouraged to continue Mucinex and Loratadine with increased fluid intake. Recheck pending x-ray report. - HYDROcodone-homatropine (HYCODAN) 5-1.5 MG/5ML syrup; Take 5 mLs by mouth every 8 (eight) hours as needed for cough.  Dispense: 120 mL; Refill: 0 - DG Chest 2 View - amoxicillin-clavulanate (AUGMENTIN) 875-125 MG tablet; Take 1 tablet by mouth 2 (two) times daily.  Dispense: 20 tablet; Refill: 0

## 2015-03-04 NOTE — Patient Instructions (Signed)

## 2015-03-06 ENCOUNTER — Telehealth: Payer: Self-pay

## 2015-03-06 NOTE — Telephone Encounter (Signed)
LMTCB

## 2015-03-06 NOTE — Telephone Encounter (Signed)
-----   Message from Margo Common, [place] sent at 03/05/2015  5:45 PM EST ----- No evidence of pneumonia on chest x-ray. Finish all the antibiotic and recheck if needed in a week. May need inhalers daily for awhile or referral to a pulmonologist.

## 2015-03-07 NOTE — Telephone Encounter (Signed)
Patient advised as directed below. Patient verbalized understanding.  

## 2015-03-15 ENCOUNTER — Telehealth: Payer: Self-pay | Admitting: Family Medicine

## 2015-03-15 DIAGNOSIS — J180 Bronchopneumonia, unspecified organism: Secondary | ICD-10-CM

## 2015-03-15 MED ORDER — HYDROCODONE-HOMATROPINE 5-1.5 MG/5ML PO SYRP: 5.0000 mL | ORAL_SOLUTION | Freq: Three times a day (TID) | ORAL | Status: DC | PRN

## 2015-03-15 MED ORDER — AMOXICILLIN-POT CLAVULANATE 875-125 MG PO TABS: 1.0000 | ORAL_TABLET | Freq: Two times a day (BID) | ORAL | Status: DC

## 2015-03-15 NOTE — Telephone Encounter (Signed)
Sent in Augmentin and rx for Hydrocodone printed.  Thanks.

## 2015-03-15 NOTE — Telephone Encounter (Signed)
Patient reports she will get her husband to pick up prescription. Advised patient.

## 2015-03-15 NOTE — Telephone Encounter (Signed)
Pt contacted office for refill request on the following medications:  [facility].  CB#[phone]/MW  amoxicillin-clavulanate (AUGMENTIN) 875-125 MG tablet  HYDROcodone-homatropine (HYCODAN) 5-1.5 MG/5ML syrup

## 2015-03-18 ENCOUNTER — Other Ambulatory Visit: Payer: Self-pay

## 2015-03-18 DIAGNOSIS — M1990 Unspecified osteoarthritis, unspecified site: Secondary | ICD-10-CM

## 2015-03-18 MED ORDER — ETODOLAC 400 MG PO TABS: ORAL_TABLET | ORAL | Status: DC

## 2015-04-26 ENCOUNTER — Telehealth: Payer: Self-pay | Admitting: Family Medicine

## 2015-04-26 MED ORDER — OMEPRAZOLE 40 MG PO CPDR: 40.0000 mg | DELAYED_RELEASE_CAPSULE | Freq: Every day | ORAL | Status: DC

## 2015-04-26 NOTE — Telephone Encounter (Signed)
Pt wants to know if you can prescribe her Prilosec instead of dexilant.  Her insurance does not pay for the dexilant.    [facility] [place]  ThanksTeri

## 2015-04-26 NOTE — Telephone Encounter (Signed)
Is this okay? Lorie Cleckley Drozdowski, CMA  

## 2015-08-22 ENCOUNTER — Other Ambulatory Visit: Payer: Self-pay

## 2015-08-22 NOTE — Telephone Encounter (Signed)
Pharmacy requesting refills on medication.

## 2015-08-23 MED ORDER — OMEPRAZOLE 40 MG PO CPDR: 40.0000 mg | DELAYED_RELEASE_CAPSULE | Freq: Every day | ORAL | Status: DC

## 2015-09-29 ENCOUNTER — Other Ambulatory Visit: Payer: Self-pay | Admitting: Family Medicine

## 2015-09-29 DIAGNOSIS — M1990 Unspecified osteoarthritis, unspecified site: Secondary | ICD-10-CM

## 2015-09-30 NOTE — Telephone Encounter (Signed)
Jenni's pt.   Thanks,   -Jecenia Leamer  

## 2015-10-22 ENCOUNTER — Other Ambulatory Visit: Payer: Self-pay | Admitting: Family Medicine

## 2016-06-02 ENCOUNTER — Other Ambulatory Visit: Payer: Self-pay | Admitting: Orthopedic Surgery

## 2016-06-02 DIAGNOSIS — M25511 Pain in right shoulder: Secondary | ICD-10-CM

## 2016-06-03 ENCOUNTER — Ambulatory Visit
Admission: RE | Admit: 2016-06-03 | Discharge: 2016-06-03 | Disposition: A | Discharge status: Home / Self Care | Payer: Medicare HMO | Source: Ambulatory Visit | Attending: Orthopedic Surgery | Admitting: Orthopedic Surgery

## 2016-06-03 DIAGNOSIS — M25511 Pain in right shoulder: Secondary | ICD-10-CM | POA: Diagnosis not present

## 2016-06-03 DIAGNOSIS — M75121 Complete rotator cuff tear or rupture of right shoulder, not specified as traumatic: Secondary | ICD-10-CM | POA: Diagnosis not present

## 2017-03-16 ENCOUNTER — Other Ambulatory Visit: Payer: Self-pay | Admitting: Family

## 2017-03-16 DIAGNOSIS — Z1231 Encounter for screening mammogram for malignant neoplasm of breast: Secondary | ICD-10-CM

## 2017-08-22 ENCOUNTER — Emergency Department
Admission: EM | Admit: 2017-08-22 | Discharge: 2017-08-22 | Disposition: A | Discharge status: Home / Self Care | Payer: Medicare HMO | Attending: Emergency Medicine | Admitting: Emergency Medicine

## 2017-08-22 ENCOUNTER — Other Ambulatory Visit: Payer: Self-pay

## 2017-08-22 DIAGNOSIS — Z79899 Other long term (current) drug therapy: Secondary | ICD-10-CM | POA: Insufficient documentation

## 2017-08-22 DIAGNOSIS — Z85850 Personal history of malignant neoplasm of thyroid: Secondary | ICD-10-CM | POA: Diagnosis not present

## 2017-08-22 DIAGNOSIS — Z87891 Personal history of nicotine dependence: Secondary | ICD-10-CM | POA: Diagnosis not present

## 2017-08-22 DIAGNOSIS — E039 Hypothyroidism, unspecified: Secondary | ICD-10-CM | POA: Insufficient documentation

## 2017-08-22 DIAGNOSIS — Y9389 Activity, other specified: Secondary | ICD-10-CM | POA: Insufficient documentation

## 2017-08-22 DIAGNOSIS — X58XXXA Exposure to other specified factors, initial encounter: Secondary | ICD-10-CM | POA: Insufficient documentation

## 2017-08-22 DIAGNOSIS — T161XXA Foreign body in right ear, initial encounter: Secondary | ICD-10-CM | POA: Diagnosis present

## 2017-08-22 DIAGNOSIS — Y998 Other external cause status: Secondary | ICD-10-CM | POA: Diagnosis not present

## 2017-08-22 DIAGNOSIS — Y929 Unspecified place or not applicable: Secondary | ICD-10-CM | POA: Diagnosis not present

## 2017-08-22 NOTE — ED Notes (Signed)

## 2017-08-22 NOTE — ED Triage Notes (Signed)
Pt states she has a Qtip stuck in her right ear,

## 2017-08-22 NOTE — ED Provider Notes (Signed)
[facility] Emergency Department Provider Note  ____________________________________________  Time seen: Approximately 12:12 PM  I have reviewed the triage vital signs and the nursing notes.   HISTORY  Chief Complaint Foreign Body in Ear    HPI Jamie Baxter is a 67 y.o. female that presents emergency department for evaluation of Q-tip in the right ear since this morning.  No additional injuries.  Past Medical History:  Diagnosis Date  . Depression   . GERD (gastroesophageal reflux disease)   . Thyroid disease     Patient Active Problem List   Diagnosis Date Noted  . Allergic rhinitis 10/22/2014  . Body aches 10/22/2014  . Open bite of hand 10/22/2014  . 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 Dysfunction of eustachian tube 10/22/2014  . Gastroenteritis, non-infectious 10/22/2014  . H/O recurrent pneumonia 10/22/2014  . 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 Memory loss 10/22/2014  . Hand lesion 10/22/2014  . Shoulder pain 10/22/2014  . Osteoarthritis 10/01/2014  . Cancer of thyroid ([place]) 02/27/2014  . Malignant neoplasm of thyroid gland ([place]) 02/27/2014  . Adult hypothyroidism 06/06/2013  . Affective disorder, major 06/06/2013  . Major depressive disorder 06/06/2013  . 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 Climacteric 04/09/2006  . Esophagitis, reflux 04/09/2006  . Colitis gravis ([place]) 04/09/2006  . Ulcerative colitis ([place]) 04/09/2006    Past Surgical History:  Procedure Laterality Date  . BREAST SURGERY     breast biopsy  . COLONOSCOPY  2006, 07/17/10   Normal (except melanosis), ? hx polyps in 1990's - [place]  . THYROID LOBECTOMY  1978   goiter  . THYROIDECTOMY SUBSTERNAL  2010   left side/cancer/radioactive iodine 2 years in a row for Ca cells  . VAGINAL HYSTERECTOMY     fiboid tumors    Prior to Admission medications   Medication Sig Start Date End Date Taking? Authorizing Provider  amoxicillin-clavulanate (AUGMENTIN) 875-125  MG tablet Take 1 tablet by mouth 2 (two) times daily. 03/15/15   Margarita Rana, MD  desvenlafaxine (PRISTIQ) 100 MG 24 hr tablet Take 100 mg by mouth daily.      [provider]  etodolac (LODINE) 400 MG tablet Take 1 tablet (400 mg total) by mouth 2 (two) times daily as needed. TAKE ONE TABLET BY MOUTH TWICE DAILY AS NEEDED *STOP MOBIC AND ALEVE* 09/30/15   Birdie Sons, MD  fluticasone [facility]) 50 MCG/ACT nasal spray  09/01/14   [provider]  HYDROcodone-homatropine (HYCODAN) 5-1.5 MG/5ML syrup Take 5 mLs by mouth every 8 (eight) hours as needed for cough. 03/15/15   Margarita Rana, MD  lamoTRIgine (LAMICTAL) 150 MG tablet  09/29/14   [provider]  loratadine (CLARITIN REDITABS) 10 MG dissolvable tablet Take by mouth. 09/22/13   [provider]  omeprazole (PRILOSEC) 40 MG capsule TAKE 1 CAPSULE BY MOUTH EVERY DAY 10/22/15   Chrismon, Vickki Muff, PA  SEROQUEL XR 50 MG TB24 24 hr tablet  10/12/14   [provider]  thyroid (ARMOUR) 180 MG tablet Take 180 mg by mouth daily.      [provider]  traZODone (DESYREL) 50 MG tablet Take by mouth.    [provider]  VIIBRYD 40 MG TABS  10/09/14   [provider]    Allergies Amantadine; Benztropine; Geodon  [ziprasidone hcl]; and Sulfa antibiotics  Family History  Problem Relation Age of Onset  . GER disease Father   . Rheum arthritis Sister   . Healthy Sister   .  Hypertension Mother     Social History Social History   Tobacco Use  . Smoking status: Former Research scientist (life sciences)  . Smokeless tobacco: Never Used  Substance Use Topics  . Alcohol use: No  . Drug use: No     Review of Systems  Gastrointestinal: No nausea, no vomiting.  Musculoskeletal: Negative for musculoskeletal pain. Skin: Negative for rash, abrasions, lacerations, ecchymosis. Neurological: Negative for headaches   ____________________________________________   PHYSICAL EXAM:  VITAL SIGNS: ED Triage  Vitals [08/22/17 0908]  Enc Vitals Group     BP 133/85     Pulse Rate 92     Resp 16     Temp 98.1 F (36.7 C)     Temp Source Oral     SpO2 98 %     Weight 200 lb (90.7 kg)     Height 5\' 9"  (1.753 m)     Head Circumference      Peak Flow      Pain Score 1     Pain Loc      Pain Edu?      Excl. in [place]?      Constitutional: Alert and oriented. Well appearing and in no acute distress. Eyes: Conjunctivae are normal. PERRL. EOMI. Head: Atraumatic. ENT:      Ears: Cotton qtip in right ear canal.       Nose: No congestion/rhinnorhea.      Mouth/Throat: Mucous membranes are moist.  Neck: No stridor.  Cardiovascular: Good peripheral circulation. Respiratory: Normal respiratory effort without tachypnea or retractions.  Musculoskeletal: Full range of motion to all extremities. No gross deformities appreciated. Neurologic:  Normal speech and language. No gross focal neurologic deficits are appreciated.  Skin:  Skin is warm, dry and intact. No rash noted. Psychiatric: Mood and affect are normal. Speech and behavior are normal. Patient exhibits appropriate insight and judgement.   ____________________________________________   LABS (all labs ordered are listed, but only abnormal results are displayed)  Labs Reviewed - No data to display ____________________________________________  EKG   ____________________________________________  RADIOLOGY   No results found.  ____________________________________________    PROCEDURES  Procedure(s) performed:    .Foreign Body Removal Date/Time: 08/22/2017 12:14 PM Performed by: Laban Emperor, PA-C Authorized by: Laban Emperor, PA-C  Consent: Verbal consent obtained. Risks and benefits: risks, benefits and alternatives were discussed Consent given by: patient Patient understanding: patient states understanding of the procedure being performed Patient identity confirmed: verbally with patient Body area:  ear  Sedation: Patient sedated: no  Patient restrained: no Patient cooperative: yes Localization method: visualized Removal mechanism: alligator forceps Complexity: simple 1 objects recovered. Objects recovered: 1 Post-procedure assessment: foreign body removed Patient tolerance: Patient tolerated the procedure well with no immediate complications      Medications - No data to display   ____________________________________________   INITIAL IMPRESSION / ASSESSMENT AND PLAN / ED COURSE  Pertinent labs & imaging results that were available during my care of the patient were reviewed by me and considered in my medical decision making (see chart for details).  Review of the [place] CSRS was performed in accordance of the [place] prior to dispensing any controlled drugs.     Patient's diagnosis is consistent with foreign body removal. Vital signs and exam are reassuring. Qtip removed without difficulty.  Patient is to follow up with ENT as directed. Patient is given ED precautions to return to the ED for any worsening or new symptoms.     ____________________________________________  FINAL CLINICAL IMPRESSION(S) /  ED DIAGNOSES  Final diagnoses:  Foreign body of right ear, initial encounter      NEW MEDICATIONS STARTED DURING THIS VISIT:  ED Discharge Orders    None          This chart was dictated using voice recognition software/Dragon. Despite best efforts to proofread, errors can occur which can change the meaning. Any change was purely unintentional.    Laban Emperor, PA-C 08/22/17 1215    Clearnce Hasten Randall An, MD 08/22/17 [phone]

## 2018-03-17 ENCOUNTER — Other Ambulatory Visit: Payer: Self-pay | Admitting: Family

## 2018-03-17 DIAGNOSIS — Z1231 Encounter for screening mammogram for malignant neoplasm of breast: Secondary | ICD-10-CM

## 2018-04-05 ENCOUNTER — Ambulatory Visit
Admission: RE | Admit: 2018-04-05 | Discharge: 2018-04-05 | Disposition: A | Discharge status: Home / Self Care | Payer: Medicare Other | Source: Ambulatory Visit | Attending: Family | Admitting: Family

## 2018-04-05 ENCOUNTER — Other Ambulatory Visit: Payer: Self-pay | Admitting: Family

## 2018-04-05 DIAGNOSIS — Z1231 Encounter for screening mammogram for malignant neoplasm of breast: Secondary | ICD-10-CM | POA: Diagnosis not present

## 2018-04-05 DIAGNOSIS — Z13820 Encounter for screening for osteoporosis: Secondary | ICD-10-CM

## 2018-05-23 ENCOUNTER — Other Ambulatory Visit: Payer: Medicare Other

## 2018-07-12 ENCOUNTER — Other Ambulatory Visit: Payer: Self-pay

## 2018-07-12 ENCOUNTER — Encounter: Payer: Medicare Other | Admitting: Psychiatry

## 2018-07-12 ENCOUNTER — Encounter: Payer: Self-pay | Admitting: Psychiatry

## 2018-07-18 ENCOUNTER — Ambulatory Visit
Admission: RE | Admit: 2018-07-18 | Discharge: 2018-07-18 | Disposition: A | Discharge status: Home / Self Care | Payer: Medicare Other | Source: Ambulatory Visit | Attending: Family | Admitting: Family

## 2018-07-18 ENCOUNTER — Other Ambulatory Visit: Payer: Self-pay

## 2018-07-18 DIAGNOSIS — Z13820 Encounter for screening for osteoporosis: Secondary | ICD-10-CM | POA: Insufficient documentation

## 2018-07-18 DIAGNOSIS — M8589 Other specified disorders of bone density and structure, multiple sites: Secondary | ICD-10-CM | POA: Insufficient documentation

## 2018-08-11 ENCOUNTER — Encounter: Payer: Self-pay | Admitting: Psychiatry

## 2018-08-11 ENCOUNTER — Ambulatory Visit (INDEPENDENT_AMBULATORY_CARE_PROVIDER_SITE_OTHER): Payer: Medicare Other | Admitting: Psychiatry

## 2018-08-11 ENCOUNTER — Other Ambulatory Visit: Payer: Self-pay

## 2018-08-11 DIAGNOSIS — F410 Panic disorder [episodic paroxysmal anxiety] without agoraphobia: Secondary | ICD-10-CM | POA: Insufficient documentation

## 2018-08-11 DIAGNOSIS — F331 Major depressive disorder, recurrent, moderate: Secondary | ICD-10-CM | POA: Diagnosis not present

## 2018-08-11 MED ORDER — FLUOXETINE HCL 40 MG PO CAPS: 40.0000 mg | ORAL_CAPSULE | Freq: Every day | ORAL | 0 refills | Status: DC
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
## 2018-09-12 ENCOUNTER — Other Ambulatory Visit: Payer: Self-pay

## 2018-09-12 ENCOUNTER — Encounter: Payer: Self-pay | Admitting: Licensed Clinical Social Worker

## 2018-09-12 ENCOUNTER — Ambulatory Visit (INDEPENDENT_AMBULATORY_CARE_PROVIDER_SITE_OTHER): Payer: Medicare Other | Admitting: Licensed Clinical Social Worker

## 2018-09-12 DIAGNOSIS — F331 Major depressive disorder, recurrent, moderate: Secondary | ICD-10-CM

## 2018-09-12 DIAGNOSIS — F410 Panic disorder [episodic paroxysmal anxiety] without agoraphobia: Secondary | ICD-10-CM
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
## 2018-09-19 ENCOUNTER — Other Ambulatory Visit: Payer: Self-pay

## 2018-09-19 ENCOUNTER — Ambulatory Visit (INDEPENDENT_AMBULATORY_CARE_PROVIDER_SITE_OTHER): Payer: Self-pay | Admitting: Psychiatry

## 2018-09-19 DIAGNOSIS — Z5329 Procedure and treatment not carried out because of patient's decision for other reasons: Secondary | ICD-10-CM

## 2018-09-19 NOTE — Progress Notes (Signed)
No response to call 

## 2018-09-29 ENCOUNTER — Other Ambulatory Visit: Payer: Self-pay | Admitting: Psychiatry

## 2018-09-29 DIAGNOSIS — F410 Panic disorder [episodic paroxysmal anxiety] without agoraphobia: Secondary | ICD-10-CM

## 2018-09-29 DIAGNOSIS — F331 Major depressive disorder, recurrent, moderate: Secondary | ICD-10-CM

## 2018-10-03 ENCOUNTER — Ambulatory Visit: Payer: Medicare Other | Admitting: Licensed Clinical Social Worker

## 2018-10-03 ENCOUNTER — Other Ambulatory Visit: Payer: Self-pay

## 2018-10-11 ENCOUNTER — Encounter: Payer: Self-pay | Admitting: Psychiatry

## 2018-10-11 ENCOUNTER — Other Ambulatory Visit: Payer: Self-pay

## 2018-10-11 ENCOUNTER — Ambulatory Visit (INDEPENDENT_AMBULATORY_CARE_PROVIDER_SITE_OTHER): Payer: Medicare Other | Admitting: Psychiatry

## 2018-10-11 DIAGNOSIS — F331 Major depressive disorder, recurrent, moderate: Secondary | ICD-10-CM | POA: Diagnosis not present

## 2018-10-11 DIAGNOSIS — F410 Panic disorder [episodic paroxysmal anxiety] without agoraphobia: Secondary | ICD-10-CM

## 2018-10-11 MED ORDER — HYDROXYZINE PAMOATE 25 MG PO CAPS: 25.0000 mg | ORAL_CAPSULE | Freq: Every day | ORAL | 1 refills | Status: DC | PRN

## 2018-10-11 MED ORDER — FLUOXETINE HCL 20 MG PO CAPS: 20.0000 mg | ORAL_CAPSULE | Freq: Every day | ORAL | 0 refills | Status: DC

## 2018-10-11 NOTE — Progress Notes (Signed)
Virtual Visit via Video Note  I connected with Jamie Baxter on 10/11/18 at  1:45 PM EDT by a video enabled telemedicine application and verified that I am speaking with the correct person using two identifiers.   I discussed the limitations of evaluation and management by telemedicine and the availability of in person appointments. The patient expressed understanding and agreed to proceed.  I discussed the assessment and treatment plan with the patient. The patient was provided an opportunity to ask questions and all were answered. The patient agreed with the plan and demonstrated an understanding of the instructions.   The patient was advised to call back or seek an in-person evaluation if the symptoms worsen or if the condition fails to improve as anticipated.   [facility] MD OP Progress Note  10/11/2018 4:19 PM Jamie Baxter  MRN:  [id number]  Chief Complaint:  Chief Complaint    Follow-up     HPI: Jamie Baxter is a 68 year old Caucasian female, married, retired, lives in [place], has a history of MDD, panic disorder, osteoarthritis, history of thyroid cancer, GERD was evaluated by telemedicine today.  Patient reports that her mother passed away recently.  She reports she was able to spend some time with her mother and that helps her a lot.  She reports she is taking it day by day and is trying to work  with her therapist Ms. Alden Hipp.  She reports she does have some on and off panic attacks ever since her mother died.  She is on Prozac.  She is interested in dosage increase.  Also discussed adding hydroxyzine as needed for panic attacks.  Patient reports sleep is good.  She denies any suicidality, homicidality or perceptual disturbances.  Patient appeared to be alert, oriented to person place time and situation.   Visit Diagnosis:    ICD-10-CM   1. MDD (major depressive disorder), recurrent episode, moderate (HCC)  F33.1 FLUoxetine (PROZAC) 20 MG capsule    hydrOXYzine (VISTARIL) 25  MG capsule  2. Panic disorder  F41.0 FLUoxetine (PROZAC) 20 MG capsule    hydrOXYzine (VISTARIL) 25 MG capsule    Past Psychiatric History: I have reviewed past psychiatric history from my progress note on 08/11/2018.  Past trials of Pristiq, Prozac, Lamictal, Seroquel.  Past Medical History:  Past Medical History:  Diagnosis Date  . Anxiety   . Depression   . GERD (gastroesophageal reflux disease)   . Thyroid disease     Past Surgical History:  Procedure Laterality Date  . ABSCESS DRAINAGE    . BREAST BIOPSY    . BREAST SURGERY     breast biopsy  . COLONOSCOPY  2006, 07/17/10   Normal (except melanosis), ? hx polyps in 1990's - [place]  . rotator cuff    . THYROID LOBECTOMY  1978   goiter  . THYROIDECTOMY SUBSTERNAL  2010   left side/cancer/radioactive iodine 2 years in a row for Ca cells  . VAGINAL HYSTERECTOMY     fiboid tumors    Family Psychiatric History: I have reviewed family psychiatric history from my progress note on 08/11/2018  Family History:  Family History  Problem Relation Age of Onset  . GER disease Father   . Rheum arthritis Sister   . Healthy Sister   . Hypertension Mother   . Mental illness Neg Hx     Social History: I have reviewed social history from my progress note on 08/11/2018 Social History   Socioeconomic History  . Marital status:  Married    Spouse name: monroe  . Number of children: 1  . Years of education: Not on file  . Highest education level: High school graduate  Occupational History  . Not on file  Social Needs  . Financial resource strain: Not hard at all  . Food insecurity    Worry: Never true    Inability: Never true  . Transportation needs    Medical: No    Non-medical: No  Tobacco Use  . Smoking status: Former Smoker    Types: Cigarettes    Quit date: 02/03/1976    Years since quitting: 42.7  . Smokeless tobacco: Never Used  Substance and Sexual Activity  . Alcohol use: No  . Drug use: No  . Sexual activity: Not  Currently  Lifestyle  . Physical activity    Days per week: 5 days    Minutes per session: 20 min  . Stress: Not at all  Relationships  . Social Herbalist on phone: Not on file    Gets together: Not on file    Attends religious service: More than 4 times per year    Active member of club or organization: No    Attends meetings of clubs or organizations: Never    Relationship status: Married  Other Topics Concern  . Not on file  Social History Narrative  . Not on file    Allergies:  Allergies  Allergen Reactions  . Amantadine Other (See Comments)    disorientation  . Benztropine   . Geodon  [Ziprasidone Hcl] Other (See Comments)    disorientation  . Sulfa Antibiotics     Metabolic Disorder Labs: No results found for: HGBA1C, MPG No results found for: PROLACTIN No results found for: CHOL, TRIG, HDL, CHOLHDL, VLDL, LDLCALC No results found for: TSH  Therapeutic Level Labs: No results found for: LITHIUM No results found for: VALPROATE No components found for:  CBMZ  Current Medications: Current Outpatient Medications  Medication Sig Dispense Refill  . B Complex Vitamins (B COMPLEX PO) Take by mouth.    . Coenzyme Q10 (COQ10 PO) Take by mouth.    . etodolac (LODINE) 400 MG tablet Take by mouth.    Marland Kitchen FLUoxetine (PROZAC) 20 MG capsule Take 1 capsule (20 mg total) by mouth daily. To be combined with 40 mg 90 capsule 0  . FLUoxetine (PROZAC) 40 MG capsule TAKE 1 CAPSULE BY MOUTH  DAILY 90 capsule 3  . fluticasone (FLONASE) 50 MCG/ACT nasal spray   1  . GLUCOSAMINE-CHONDROITIN PO Take by mouth.    . hydrOXYzine (VISTARIL) 25 MG capsule Take 1 capsule (25 mg total) by mouth daily as needed. To be taken 1-3 times a week as needed for severe panic attacks 10 capsule 1  . lamoTRIgine (LAMICTAL) 150 MG tablet   2  . Levothyroxine Sodium (SYNTHROID PO) Take by mouth.    . loratadine (CLARITIN REDITABS) 10 MG dissolvable tablet Take by mouth.    Marland Kitchen MILK THISTLE PO  Take by mouth.    Marland Kitchen omeprazole (PRILOSEC) 40 MG capsule TAKE 1 CAPSULE BY MOUTH EVERY DAY 90 capsule 0  . QUEtiapine Fumarate (SEROQUEL XR) 150 MG 24 hr tablet     . traZODone (DESYREL) 50 MG tablet Take by mouth.    . triamcinolone cream (KENALOG) 0.1 % TRIAMCINOLONE ACETONIDE, 0.1% (External Cream)  1 (one) Cream Cream use as directed for 0 days  Quantity: 30;  Refills: 1   Ordered :12-Jan-2014  Meyer Cory ;  Started 12-Jan-2014 Active    . VITAMIN D PO Take by mouth.    Marland Kitchen VITAMIN E PO Take by mouth.     Current Facility-Administered Medications  Medication Dose Route Frequency Provider Last Rate Last Dose  . 0.9 %  sodium chloride infusion  500 mL Intravenous Continuous Gatha Mayer, MD         Musculoskeletal: Strength & Muscle Tone: UTA Gait & Station: WNL Patient leans: N/A  Psychiatric Specialty Exam: Review of Systems  Psychiatric/Behavioral: The patient is nervous/anxious.   All other systems reviewed and are negative.   There were no vitals taken for this visit.There is no height or weight on file to calculate BMI.  General Appearance: Casual  Eye Contact:  Fair  Speech:  Normal Rate  Volume:  Normal  Mood:  Anxious and Depressed  Affect:  Appropriate  Thought Process:  Goal Directed and Descriptions of Associations: Intact  Orientation:  Full (Time, Place, and Person)  Thought Content: Logical   Suicidal Thoughts:  No  Homicidal Thoughts:  No  Memory:  Immediate;   Fair Recent;   Fair Remote;   Fair  Judgement:  Fair  Insight:  Fair  Psychomotor Activity:  Normal  Concentration:  Concentration: Fair and Attention Span: Fair  Recall:  [employer] of Knowledge: Fair  Language: Fair  Akathisia:  No  Handed:  Right  AIMS (if indicated): Denies tremors, rigidity  Assets:  Communication Skills Desire for Improvement Social Support  ADL's:  Intact  Cognition: WNL  Sleep:  Fair   Screenings: PHQ2-9     Office Visit from 12/03/2014 in  [facility]  PHQ-2 Total Score  2  PHQ-9 Total Score  20       Assessment and Plan: Jamie Baxter is a 68 year old Caucasian female who has a history of MDD, panic attacks, GERD, thyroid cancer in remission, osteoarthritis was evaluated by telemedicine today.  Patient is biologically predisposed given her history of medical problems as well as trauma.  Patient also is currently going through the grief of losing her mother recently.  She will benefit from medication readjustment for her anxiety and depressive symptoms as well as will benefit from psychotherapy sessions.  Plan MDD- some progress Increase Prozac to 60 mg p.o. daily Seroquel XR 150 mg p.o. nightly Lamotrigine 150 mg p.o. twice daily Continue CBT with her therapist Ms. Alden Hipp  Panic disorder- some progress Increase Prozac to 60 mg p.o. daily  Insomnia-stable Trazodone 50 mg-she takes 1 to 3 tablets at bedtime as needed.  Following labs are pending-TSH, lipid panel, hemoglobin A1c, prolactin.  Patient to sign a release to obtain medical records from her oncologist as well as her previous psychiatrist.  Follow-up in clinic in 6 to 8 weeks or sooner if needed.  October 29 at 10:30 AM  I have spent atleast 25 minutes non face to face with patient today. More than 50 % of the time was spent for psychoeducation and supportive psychotherapy and care coordination. This note was generated in part or whole with voice recognition software. Voice recognition is usually quite accurate but there are transcription errors that can and very often do occur. I apologize for any typographical errors that were not detected and corrected.       Ursula Alert, MD 10/11/2018, 4:19 PM

## 2018-11-17 ENCOUNTER — Encounter: Payer: Self-pay | Admitting: Licensed Clinical Social Worker

## 2018-11-17 ENCOUNTER — Other Ambulatory Visit: Payer: Self-pay

## 2018-11-17 ENCOUNTER — Ambulatory Visit (INDEPENDENT_AMBULATORY_CARE_PROVIDER_SITE_OTHER): Payer: Medicare Other | Admitting: Licensed Clinical Social Worker

## 2018-11-17 DIAGNOSIS — F331 Major depressive disorder, recurrent, moderate: Secondary | ICD-10-CM | POA: Diagnosis not present

## 2018-11-17 NOTE — Progress Notes (Signed)
  Virtual Visit via Video Note  I connected with [name] on 11/17/18 at  1:30 PM EDT by a video enabled telemedicine application and verified that I am speaking with the correct person using two identifiers.   I discussed the limitations of evaluation and management by telemedicine and the availability of in person appointments. The patient expressed understanding and agreed to proceed.  I discussed the assessment and treatment plan with the patient. The patient was provided an opportunity to ask questions and all were answered. The patient agreed with the plan and demonstrated an understanding of the instructions.   The patient was advised to call back or seek an in-person evaluation if the symptoms worsen or if the condition fails to improve as anticipated.  I provided 30 minutes of non-face-to-face time during this encounter.   Alden Hipp, LCSW   THERAPIST PROGRESS NOTE  Session Time: 1330  Participation Level: Active  Behavioral Response: CasualAlertAnxious  Type of Therapy: Individual Therapy  Treatment Goals addressed: Anxiety  Interventions: Supportive  Summary: SHALAYAH NICOTERA is a 68 y.o. female who presents with continued symptoms related to her diagnosis. Montia reports doing well since our last session. She apologized for missing an appointment, but noted her mother died during that time. LCSW expressed condolences for Jalaila's loss, and asked how she was handling the loss. Raeah reported her grief has been complicated, but she has been able to manage it. She reports one day she will be happy and the next she will cry for most of the day. LCSW validated Millissa's experience and highlighted how grief is not an individual fit for anyone. We discussed the various ways grief can be experienced, and ways to manage grief when she is feeling overwhelmed. Netty reported her primary stressor at the moment is feeling panic when she is trying to go to sleep. She reports managing this  by doing deep breathing and then is ultimately able to fall asleep. LCSW validated Javon's use of this coping skill, and encouraged her to utilize other mindfulness techniques as well. We discussed what that looked like and how she could best use those skills to manage her anxiety in the moment. Kalli expressed understanding and agreement.   Suicidal/Homicidal: No  Therapist Response: Milika continues to work towards her tx goals but has not yet reached them. We will continue to work on emotional regulation skills and improving distress tolerance moving forward.   Plan: Return again in 4 weeks.  Diagnosis: Axis I: mdd    Axis II: No diagnosis    Alden Hipp, LCSW 11/17/2018

## 2018-11-19 ENCOUNTER — Other Ambulatory Visit: Payer: Self-pay | Admitting: Psychiatry

## 2018-11-19 DIAGNOSIS — F331 Major depressive disorder, recurrent, moderate: Secondary | ICD-10-CM

## 2018-11-19 DIAGNOSIS — F410 Panic disorder [episodic paroxysmal anxiety] without agoraphobia: Secondary | ICD-10-CM

## 2018-12-19 ENCOUNTER — Ambulatory Visit (INDEPENDENT_AMBULATORY_CARE_PROVIDER_SITE_OTHER): Payer: Medicare Other | Admitting: Licensed Clinical Social Worker

## 2018-12-19 ENCOUNTER — Encounter: Payer: Self-pay | Admitting: Licensed Clinical Social Worker

## 2018-12-19 ENCOUNTER — Other Ambulatory Visit: Payer: Self-pay

## 2018-12-19 DIAGNOSIS — F331 Major depressive disorder, recurrent, moderate: Secondary | ICD-10-CM

## 2018-12-19 NOTE — Progress Notes (Signed)
  Virtual Visit via Video Note  I connected with Jamie Baxter on 12/19/18 at  1:30 PM EST by a video enabled telemedicine application and verified that I am speaking with the correct person using two identifiers.   I discussed the limitations of evaluation and management by telemedicine and the availability of in person appointments. The patient expressed understanding and agreed to proceed.    I discussed the assessment and treatment plan with the patient. The patient was provided an opportunity to ask questions and all were answered. The patient agreed with the plan and demonstrated an understanding of the instructions.   The patient was advised to call back or seek an in-person evaluation if the symptoms worsen or if the condition fails to improve as anticipated.  I provided 45 minutes of non-face-to-face time during this encounter.   Alden Hipp, LCSW  THERAPIST PROGRESS NOTE  Session Time: 1330  Participation Level: Active  Behavioral Response: NeatAlertNA  Type of Therapy: Individual Therapy  Treatment Goals addressed: Anxiety  Interventions: Supportive  Summary: Jamie Baxter is a 68 y.o. female who presents with continued symptoms related to her diagnosis. Jamie Baxter reports doing well since our last session. She reports she had one major incident that caused her anxiety to rise. She stated her husband took too many of his muscle relaxers by mistake, said he couldn't breathe, and asked her to call the ambulance. She reported this event made her start thinking about her mother and brother, both of whom died after going to the hospital. LCSW validated that experience and asked her to tell the rest of the story. Jamie Baxter reported after waiting for several hours to be admitted to the ER, her husband stated he felt better and she took him home. LCSW encouraged Jamie Baxter to utilize that experience in the future to remind herself that things will be okay, or will likely be okay when she feels  nervous. Jamie Baxter was able to recognize this as well and expressed agreement. Jamie Baxter reported she was able to utilize CBT skills, such as telling herself what she would tell a friend, to decrease her anxiety symptoms in that moment. LCSW validated use of coping skills, and encouraged Jamie Baxter to continue utilizing them. Jamie Baxter reports other than that incident, she has days where she doesn't feel like doing much and usually feels guilty about that. LCSW validated that idea as well, and encouraged Jamie Baxter to allow herself to listen to her body and what it's telling her in order to feel more comfortable doing nothing at times. Jamie Baxter expressed understanding and agreement with this information as well.   Suicidal/Homicidal: No  Therapist Response: Jamie Baxter continues to work towards her tx goals but has not yet reached them. We will continue to work on emotional regulation skills moving forward, and will continue to work on improving CBT skills to manage anxiety symptoms.   Plan: Return again in 4 weeks.  Diagnosis: Axis I: MDD    Axis II: No diagnosis    Alden Hipp, LCSW 12/19/2018

## 2019-01-18 ENCOUNTER — Ambulatory Visit (INDEPENDENT_AMBULATORY_CARE_PROVIDER_SITE_OTHER): Payer: Medicare Other | Admitting: Licensed Clinical Social Worker

## 2019-01-18 ENCOUNTER — Encounter: Payer: Self-pay | Admitting: Licensed Clinical Social Worker

## 2019-01-18 ENCOUNTER — Other Ambulatory Visit: Payer: Self-pay

## 2019-01-18 DIAGNOSIS — F331 Major depressive disorder, recurrent, moderate: Secondary | ICD-10-CM | POA: Diagnosis not present

## 2019-01-18 NOTE — Progress Notes (Signed)
Virtual Visit via Video Note  I connected with Jamie Baxter on 01/18/19 at  1:30 PM EST by a video enabled telemedicine application and verified that I am speaking with the correct person using two identifiers.   I discussed the limitations of evaluation and management by telemedicine and the availability of in person appointments. The patient expressed understanding and agreed to proceed.  I discussed the assessment and treatment plan with the patient. The patient was provided an opportunity to ask questions and all were answered. The patient agreed with the plan and demonstrated an understanding of the instructions.   The patient was advised to call back or seek an in-person evaluation if the symptoms worsen or if the condition fails to improve as anticipated.  I provided 30  minutes of non-face-to-face time during this encounter.   Alden Hipp, LCSW   THERAPIST PROGRESS NOTE  Session Time: 1330  Participation Level: Active  Behavioral Response: NeatAlertAnxious  Type of Therapy: Individual Therapy  Treatment Goals addressed: Anxiety  Interventions: Supportive  Summary: Jamie Baxter is a 69 y.o. female who presents with continued symptoms related to her diagnosis. Jamie Baxter reports doing well since our last session. Jamie Baxter reports two major events occurred which have caused her anxiety and stress. She reports her sister fell off her porch which caused two stress fractures in her hand. Myeisha reported, "all things considered, I'm not too worried about her. It was just scary." LCSW validated Jamie Baxter's feelings around this situation, and encouraged her to check in on her sister as frequently as she needs to in order to manage her anxiety around the subject, but also utilize CBT skill to manage anxiety in the moment. Jamie Baxter expressed understanding and agreement. Jamie Baxter moved on to discussing her older sister who is currently in the hospital with COVID-19. Jamie Baxter reports, "she was doing better and  she was no longer intubated, but they took her off and had to put her back on." Jamie Baxter expressed feeling anxious about the outcome, but was able to point out a few positives. She reported the doctor calls her sister's daughter daily to provide updates and information on her sister's status. LCSW validated Jamie Baxter's feelings and encouraged her to stay positive. LCSW also encouraged Jamie Baxter to utilize CBT skills to challenge negative thoughts as they come up. Jamie Baxter expressed understanding and agreement. Jamie Baxter reported otherwise, things are going very well in her family and everyone is doing well.   Suicidal/Homicidal: No   Therapist Response: Jamie Baxter continues to work towards her tx goals but has not yet reached them. We will continue to work on improving emotional regulation skills and communication moving forward.   Plan: Return again in 4 weeks.  Diagnosis: Axis I: MDD    Axis II: No diagnosis    Alden Hipp, LCSW 01/18/2019

## 2019-01-30 ENCOUNTER — Encounter: Payer: Self-pay | Admitting: Psychiatry

## 2019-01-30 ENCOUNTER — Other Ambulatory Visit: Payer: Self-pay

## 2019-01-30 ENCOUNTER — Ambulatory Visit (INDEPENDENT_AMBULATORY_CARE_PROVIDER_SITE_OTHER): Payer: Medicare Other | Admitting: Psychiatry

## 2019-01-30 DIAGNOSIS — F331 Major depressive disorder, recurrent, moderate: Secondary | ICD-10-CM

## 2019-01-30 DIAGNOSIS — F410 Panic disorder [episodic paroxysmal anxiety] without agoraphobia: Secondary | ICD-10-CM | POA: Diagnosis not present

## 2019-01-30 DIAGNOSIS — G4700 Insomnia, unspecified: Secondary | ICD-10-CM | POA: Diagnosis not present

## 2019-01-30 MED ORDER — LAMOTRIGINE 150 MG PO TABS: 150.0000 mg | ORAL_TABLET | Freq: Two times a day (BID) | ORAL | 1 refills | Status: DC

## 2019-01-30 MED ORDER — TRAZODONE HCL 50 MG PO TABS: 50.0000 mg | ORAL_TABLET | Freq: Every evening | ORAL | 1 refills | Status: DC | PRN

## 2019-01-30 MED ORDER — FLUOXETINE HCL 40 MG PO CAPS: 40.0000 mg | ORAL_CAPSULE | Freq: Every day | ORAL | 3 refills | Status: DC

## 2019-01-30 MED ORDER — QUETIAPINE FUMARATE ER 150 MG PO TB24: 150.0000 mg | ORAL_TABLET | Freq: Every day | ORAL | 1 refills | Status: DC

## 2019-01-30 MED ORDER — FLUOXETINE HCL 20 MG PO CAPS: 20.0000 mg | ORAL_CAPSULE | Freq: Every day | ORAL | 0 refills | Status: DC

## 2019-01-30 NOTE — Progress Notes (Signed)
Virtual Visit via Video Baxter  I connected with Jamie Baxter on 01/30/19 at 10:45 AM EST by a video enabled telemedicine application and verified that I am speaking with the correct person using two identifiers.   I discussed the limitations of evaluation and management by telemedicine and the availability of in person appointments. The patient expressed understanding and agreed to proceed.     I discussed the assessment and treatment plan with the patient. The patient was provided an opportunity to ask questions and all were answered. The patient agreed with the plan and demonstrated an understanding of the instructions.   The patient was advised to call back or seek an in-person evaluation if the symptoms worsen or if the condition fails to improve as anticipated.   Jamie Baxter  01/30/2019 11:42 AM Jamie Baxter  MRN:  [id number]  Chief Complaint:  Chief Complaint    Follow-up     HPI: Jamie Baxter is a 68 year old Caucasian female, married, retired, lives in [place], has a history of MDD, panic disorder, osteoarthritis, history of thyroid cancer, GERD was evaluated by telemedicine today.  Patient reports her sister is currently admitted to the hospital for COVID-19 infection.  She reports that does make her anxious and sad.  Patient reports she never increased her Prozac dosage as discussed last visit.  She continues to take 40 mg daily.  Initially patient reported she never received prescription which was sent out by writer for the 20 mg however later on when she checked her prescription bottle she reported that she does have the 20 mg at home but she never took it.  Patient reports she would like to go up on her dosage today.  Patient does report some on and off panic attacks however overall she has been coping okay.  Patient denies any suicidality, homicidality or perceptual disturbances.  Patient was asked to get her labs including TSH and metabolic panel as discussed last  visit.  Patient reports she will get it done the first week of January. Visit Diagnosis:    ICD-10-CM   1. MDD (major depressive disorder), recurrent episode, moderate (HCC)  F33.1 FLUoxetine (PROZAC) 20 MG capsule    FLUoxetine (PROZAC) 40 MG capsule    QUEtiapine Fumarate (SEROQUEL XR) 150 MG 24 hr tablet    lamoTRIgine (LAMICTAL) 150 MG tablet    traZODone (DESYREL) 50 MG tablet  2. Panic disorder  F41.0 FLUoxetine (PROZAC) 20 MG capsule    FLUoxetine (PROZAC) 40 MG capsule    Past Psychiatric History: I have reviewed past psychiatric history from my progress Baxter on 08/11/2018.  Past trials of Pristiq, Prozac, Lamictal, Seroquel.  Past Medical History:  Past Medical History:  Diagnosis Date  . Anxiety   . Depression   . GERD (gastroesophageal reflux disease)   . Thyroid disease     Past Surgical History:  Procedure Laterality Date  . ABSCESS DRAINAGE    . BREAST BIOPSY    . BREAST SURGERY     breast biopsy  . COLONOSCOPY  2006, 07/17/10   Normal (except melanosis), ? hx polyps in 1990's - [place]  . rotator cuff    . THYROID LOBECTOMY  1978   goiter  . THYROIDECTOMY SUBSTERNAL  2010   left side/cancer/radioactive iodine 2 years in a row for Ca cells  . VAGINAL HYSTERECTOMY     fiboid tumors    Family Psychiatric History: I have reviewed family psychiatric history from my progress Baxter on 08/11/2018.  Family History:  Family History  Problem Relation Age of Onset  . GER disease Father   . Rheum arthritis Sister   . Healthy Sister   . Hypertension Mother   . Mental illness Neg Hx     Social History: I have reviewed social history from my progress Baxter on 08/11/2018. Social History   Socioeconomic History  . Marital status: Married    Spouse name: monroe  . Number of children: 1  . Years of education: Not on file  . Highest education level: High school graduate  Occupational History  . Not on file  Tobacco Use  . Smoking status: Former Smoker    Types:  Cigarettes    Quit date: 02/03/1976    Years since quitting: 43.0  . Smokeless tobacco: Never Used  Substance and Sexual Activity  . Alcohol use: No  . Drug use: No  . Sexual activity: Not Currently  Other Topics Concern  . Not on file  Social History Narrative  . Not on file   Social Determinants of Health   Financial Resource Strain: Low Risk   . Difficulty of Paying Living Expenses: Not hard at all  Food Insecurity: No Food Insecurity  . Worried About Charity fundraiser in the Last Year: Never true  . Ran Out of Food in the Last Year: Never true  Transportation Needs: No Transportation Needs  . Lack of Transportation (Medical): No  . Lack of Transportation (Non-Medical): No  Physical Activity: Insufficiently Active  . Days of Exercise per Week: 5 days  . Minutes of Exercise per Session: 20 min  Stress: No Stress Concern Present  . Feeling of Stress : Not at all  Social Connections: Unknown  . Frequency of Communication with Friends and Family: Not on file  . Frequency of Social Gatherings with Friends and Family: Not on file  . Attends Religious Services: More than 4 times per year  . Active Member of Clubs or Organizations: No  . Attends Archivist Meetings: Never  . Marital Status: Married    Allergies:  Allergies  Allergen Reactions  . Amantadine Other (See Comments)    disorientation  . Benztropine   . Geodon  [Ziprasidone Hcl] Other (See Comments)    disorientation  . Sulfa Antibiotics     Metabolic Disorder Labs: No results found for: HGBA1C, MPG No results found for: PROLACTIN No results found for: CHOL, TRIG, HDL, CHOLHDL, VLDL, LDLCALC No results found for: TSH  Therapeutic Level Labs: No results found for: LITHIUM No results found for: VALPROATE No components found for:  CBMZ  Current Medications: Current Outpatient Medications  Medication Sig Dispense Refill  . B Complex Vitamins (B COMPLEX PO) Take by mouth.    . Coenzyme Q10  (COQ10 PO) Take by mouth.    . etodolac (LODINE) 400 MG tablet Take by mouth.    Marland Kitchen FLUoxetine (PROZAC) 20 MG capsule Take 1 capsule (20 mg total) by mouth daily. To be combined with 40 mg 90 capsule 0  . FLUoxetine (PROZAC) 40 MG capsule Take 1 capsule (40 mg total) by mouth daily. TO BE COMBINED WITH 20 MG 90 capsule 3  . fluticasone (FLONASE) 50 MCG/ACT nasal spray   1  . GLUCOSAMINE-CHONDROITIN PO Take by mouth.    . hydrOXYzine (VISTARIL) 25 MG capsule TAKE 1 CAP BY MOUTH DAILY AS NEEDED TO BE TAKE 1-3 TIMES A WEEK AS NEEDED FOR SEVERE PANIC ATTACKS 10 capsule 1  . lamoTRIgine (LAMICTAL) 150  MG tablet Take 1 tablet (150 mg total) by mouth 2 (two) times daily. 180 tablet 1  . Levothyroxine Sodium (SYNTHROID PO) Take by mouth.    . loratadine (CLARITIN REDITABS) 10 MG dissolvable tablet Take by mouth.    Marland Kitchen MILK THISTLE PO Take by mouth.    Marland Kitchen omeprazole (PRILOSEC) 40 MG capsule TAKE 1 CAPSULE BY MOUTH EVERY DAY 90 capsule 0  . QUEtiapine Fumarate (SEROQUEL XR) 150 MG 24 hr tablet Take 1 tablet (150 mg total) by mouth at bedtime. 90 tablet 1  . traZODone (DESYREL) 50 MG tablet Take 1-3 tablets (50-150 mg total) by mouth at bedtime as needed for sleep. 270 tablet 1  . triamcinolone cream (KENALOG) 0.1 % TRIAMCINOLONE ACETONIDE, 0.1% (External Cream)  1 (one) Cream Cream use as directed for 0 days  Quantity: 30;  Refills: 1   Ordered :12-Jan-2014  Meyer Cory ;  Started 12-Jan-2014 Active    . VITAMIN D PO Take by mouth.    Marland Kitchen VITAMIN E PO Take by mouth.     Current Facility-Administered Medications  Medication Dose Route Frequency Provider Last Rate Last Admin  . 0.9 %  sodium chloride infusion  500 mL Intravenous Continuous Gatha Mayer, MD         Musculoskeletal: Strength & Muscle Tone: UTA Gait & Station: normal Patient leans: N/A  Psychiatric Specialty Exam: Review of Systems  Psychiatric/Behavioral: Positive for dysphoric mood. Negative for agitation, behavioral  problems, confusion, decreased concentration, hallucinations, self-injury, sleep disturbance and suicidal ideas. The patient is not nervous/anxious and is not hyperactive.   All other systems reviewed and are negative.   There were no vitals taken for this visit.There is no height or weight on file to calculate BMI.  General Appearance: Casual  Eye Contact:  Fair  Speech:  Normal Rate  Volume:  Normal  Mood:  Depressed  Affect:  Congruent  Thought Process:  Goal Directed and Descriptions of Associations: Intact  Orientation:  Full (Time, Place, and Person)  Thought Content: Logical   Suicidal Thoughts:  No  Homicidal Thoughts:  No  Memory:  Immediate;   Fair Recent;   Fair Remote;   Fair  Judgement:  Fair  Insight:  Good  Psychomotor Activity:  Normal  Concentration:  Concentration: Fair and Attention Span: Fair  Recall:  [employer] of Knowledge: Fair  Language: Fair  Akathisia:  No  Handed:  Right  AIMS (if indicated):   Assets:  Communication Skills Desire for Improvement Social Support  ADL's:  Intact  Cognition: WNL  Sleep:  Fair   Screenings: PHQ2-9     Office Visit from 12/03/2014 in [facility]  PHQ-2 Total Score  2  PHQ-9 Total Score  20       Assessment and Plan: Evalyna is a 68 year old Caucasian female who has a history of MDD, panic attacks, GERD, thyroid cancer in remission, osteoarthritis was evaluated by telemedicine today.  Patient is biologically predisposed given her history of medical problems as well as trauma.  Patient is currently going through psychosocial stressors of the death of her mother as well as her sister who is currently struggling with health issues.  Patient will benefit from medication readjustment to address her mood symptoms.  Patient has been noncompliant with the increased dosage of Prozac last visit.  Patient agrees to start taking the higher dosage today.  Plan as noted below.  Plan MDD-some progress Increase  Prozac 60 mg p.o. daily.  Patient has  been noncompliant and stayed on the 40 mg. Seroquel XR 150 mg p.o. nightly Lamotrigine 150 mg p.o. twice daily Continue CBT with her therapist Ms. Alden Hipp  Panic disorder-some progress Increase Prozac to 60 mg p.o. daily  Insomnia-stable Trazodone 50 mg - 1 to 3 tablets at bedtime as needed  Pending labs-TSH, lipid panel, hemoglobin A1c and prolactin.  Patient agrees to get it done.  Follow-up in clinic in 6 to 8 weeks or sooner if needed.  February 24 at 11:30 AM  I have spent atleast 15 minutes non face to face with patient today. More than 50 % of the time was spent for psychoeducation and supportive psychotherapy and care coordination. This Baxter was generated in part or whole with voice recognition software. Voice recognition is usually quite accurate but there are transcription errors that can and very often do occur. I apologize for any typographical errors that were not detected and corrected.       Ursula Alert, MD 01/30/2019, 11:42 AM

## 2019-02-16 ENCOUNTER — Encounter: Payer: Self-pay | Admitting: Licensed Clinical Social Worker

## 2019-02-16 ENCOUNTER — Other Ambulatory Visit: Payer: Self-pay

## 2019-02-16 ENCOUNTER — Ambulatory Visit (INDEPENDENT_AMBULATORY_CARE_PROVIDER_SITE_OTHER): Payer: Medicare Other | Admitting: Licensed Clinical Social Worker

## 2019-02-16 DIAGNOSIS — F331 Major depressive disorder, recurrent, moderate: Secondary | ICD-10-CM

## 2019-02-16 NOTE — Progress Notes (Signed)
Virtual Visit via Telephone Note  I connected with Jamie Baxter on 02/16/19 at  1:30 PM EST by telephone and verified that I am speaking with the correct person using two identifiers.   I discussed the limitations, risks, security and privacy concerns of performing an evaluation and management service by telephone and the availability of in person appointments. I also discussed with the patient that there may be a patient responsible charge related to this service. The patient expressed understanding and agreed to proceed.   I discussed the assessment and treatment plan with the patient. The patient was provided an opportunity to ask questions and all were answered. The patient agreed with the plan and demonstrated an understanding of the instructions.   The patient was advised to call back or seek an in-person evaluation if the symptoms worsen or if the condition fails to improve as anticipated.  I provided 25 minutes of non-face-to-face time during this encounter.   Alden Hipp, LCSW    THERAPIST PROGRESS NOTE  Session Time: 1330  Participation Level: Active  Behavioral Response: NeatAlertNA  Type of Therapy: Individual Therapy  Treatment Goals addressed: Coping  Interventions: Supportive  Summary: Jamie Baxter is a 69 y.o. female who presents with continued symptoms related to her diagnosis. Abyan reports doing well since our last session. She reports her sister is still in the hospital but is going to be transferred from the hospital to a rehab center soon. She reports she is making great progress, but is having to learn how to walk and chew again, after not being on her feet for an entire month. Duane expressed she was able to manage her anxiety through this situation, and was able to recognize which parts of the situation she could control and which parts she could not. LCSW validated Jolene's use of coping skills through what had to be a stressful situation. Brittinie expressed  agreement, and noted she was surprised at how well she was able to handle it but was also proud of herself. Arnessa went on to discuss not having to take the PRN medication the MD gave her, "I've only taken it once or twice." LCSW validated Caryl's accomplishment and encouraged her to recognize her mental health has gotten better because she is making an effort to make it better by utilizing the skills in the moment. Charlesa was able to recognize this as well, and expressed pride in herself. We agreed to move therapy sessions to every other month to ensure Cait's improvements continue.   Suicidal/Homicidal: No  Therapist Response: Tarya continues to work towards her tx goals but has not yet reached them. We will continue to work on improving emotional regulation and distress tolerance skills.   Plan: Return again in 8 weeks.  Diagnosis: Axis I: Major Depression, Recurrent severe    Axis II: No diagnosis    Alden Hipp, LCSW 02/16/2019

## 2019-03-29 ENCOUNTER — Other Ambulatory Visit: Payer: Self-pay

## 2019-03-29 ENCOUNTER — Ambulatory Visit (INDEPENDENT_AMBULATORY_CARE_PROVIDER_SITE_OTHER): Payer: Medicare Other | Admitting: Psychiatry

## 2019-03-29 ENCOUNTER — Encounter: Payer: Self-pay | Admitting: Psychiatry

## 2019-03-29 DIAGNOSIS — F3341 Major depressive disorder, recurrent, in partial remission: Secondary | ICD-10-CM | POA: Diagnosis not present

## 2019-03-29 DIAGNOSIS — F410 Panic disorder [episodic paroxysmal anxiety] without agoraphobia: Secondary | ICD-10-CM | POA: Diagnosis not present

## 2019-03-29 MED ORDER — FLUOXETINE HCL 20 MG PO CAPS: 20.0000 mg | ORAL_CAPSULE | Freq: Every day | ORAL | 2 refills | Status: DC

## 2019-03-29 NOTE — Progress Notes (Signed)
Provider Location : [facility] Patient Location : Home  Virtual Visit via Video Note  I connected with Jamie Baxter on 03/29/19 at 11:30 AM EST by a video enabled telemedicine application and verified that I am speaking with the correct person using two identifiers.   I discussed the limitations of evaluation and management by telemedicine and the availability of in person appointments. The patient expressed understanding and agreed to proceed.    I discussed the assessment and treatment plan with the patient. The patient was provided an opportunity to ask questions and all were answered. The patient agreed with the plan and demonstrated an understanding of the instructions.   The patient was advised to call back or seek an in-person evaluation if the symptoms worsen or if the condition fails to improve as anticipated.  [facility] MD OP Progress Note  03/29/2019 11:54 AM COLETHA ARRASMITH  MRN:  [id number]  Chief Complaint:  Chief Complaint    Follow-up     HPI: Jamie Baxter is a 69 year old Caucasian female, married, retired, lives in [place], has a history of MDD, panic disorder, osteoarthritis, history of thyroid cancer, GERD was evaluated by telemedicine today.  Patient today reports she is currently making progress on the current medication regimen.  She is compliant on the Prozac as prescribed.  She denies side effects.  Her anxiety and mood symptoms have improved.  She is not as depressed as she used to be before.  She is sleeping well on the trazodone.  She is taking 2 of the trazodone tablets-50 mg each.  That does help.  Patient is currently following up with Ms. Alden Hipp for therapy sessions and reports therapy sessions is beneficial.  Patient denies any other concerns today. Visit Diagnosis:    ICD-10-CM   1. MDD (major depressive disorder), recurrent, in partial remission (HCC)  F33.41 FLUoxetine (PROZAC) 20 MG capsule  2. Panic disorder  F41.0 FLUoxetine (PROZAC) 20 MG capsule     Past Psychiatric History: I have reviewed past psychiatric history from my progress note on 08/11/2018.  Past trials of Pristiq, Prozac, Lamictal, Seroquel  Past Medical History:  Past Medical History:  Diagnosis Date  . Anxiety   . Depression   . GERD (gastroesophageal reflux disease)   . Thyroid disease     Past Surgical History:  Procedure Laterality Date  . ABSCESS DRAINAGE    . BREAST BIOPSY    . BREAST SURGERY     breast biopsy  . COLONOSCOPY  2006, 07/17/10   Normal (except melanosis), ? hx polyps in 1990's - [place]  . rotator cuff    . THYROID LOBECTOMY  1978   goiter  . THYROIDECTOMY SUBSTERNAL  2010   left side/cancer/radioactive iodine 2 years in a row for Ca cells  . VAGINAL HYSTERECTOMY     fiboid tumors    Family Psychiatric History: I have reviewed family psychiatric history from my progress note on 08/11/2018  Family History:  Family History  Problem Relation Age of Onset  . GER disease Father   . Rheum arthritis Sister   . Healthy Sister   . Hypertension Mother   . Mental illness Neg Hx     Social History: Reviewed social history from my progress note on 08/11/2018 Social History   Socioeconomic History  . Marital status: Married    Spouse name: monroe  . Number of children: 1  . Years of education: Not on file  . Highest education level: High school graduate  Occupational History  .  Not on file  Tobacco Use  . Smoking status: Former Smoker    Types: Cigarettes    Quit date: 02/03/1976    Years since quitting: 43.1  . Smokeless tobacco: Never Used  Substance and Sexual Activity  . Alcohol use: No  . Drug use: No  . Sexual activity: Not Currently  Other Topics Concern  . Not on file  Social History Narrative  . Not on file   Social Determinants of Health   Financial Resource Strain: Low Risk   . Difficulty of Paying Living Expenses: Not hard at all  Food Insecurity: No Food Insecurity  . Worried About Charity fundraiser in the Last  Year: Never true  . Ran Out of Food in the Last Year: Never true  Transportation Needs: No Transportation Needs  . Lack of Transportation (Medical): No  . Lack of Transportation (Non-Medical): No  Physical Activity: Insufficiently Active  . Days of Exercise per Week: 5 days  . Minutes of Exercise per Session: 20 min  Stress: No Stress Concern Present  . Feeling of Stress : Not at all  Social Connections: Unknown  . Frequency of Communication with Friends and Family: Not on file  . Frequency of Social Gatherings with Friends and Family: Not on file  . Attends Religious Services: More than 4 times per year  . Active Member of Clubs or Organizations: No  . Attends Archivist Meetings: Never  . Marital Status: Married    Allergies:  Allergies  Allergen Reactions  . Amantadine Other (See Comments)    disorientation  . Benztropine   . Geodon  [Ziprasidone Hcl] Other (See Comments)    disorientation  . Sulfa Antibiotics     Metabolic Disorder Labs: No results found for: HGBA1C, MPG No results found for: PROLACTIN No results found for: CHOL, TRIG, HDL, CHOLHDL, VLDL, LDLCALC No results found for: TSH  Therapeutic Level Labs: No results found for: LITHIUM No results found for: VALPROATE No components found for:  CBMZ  Current Medications: Current Outpatient Medications  Medication Sig Dispense Refill  . B Complex Vitamins (B COMPLEX PO) Take by mouth.    . Coenzyme Q10 (COQ10 PO) Take by mouth.    . etodolac (LODINE) 400 MG tablet Take by mouth.    Marland Kitchen FLUoxetine (PROZAC) 20 MG capsule Take 1 capsule (20 mg total) by mouth daily. To be combined with 40 mg 90 capsule 2  . FLUoxetine (PROZAC) 40 MG capsule Take 1 capsule (40 mg total) by mouth daily. TO BE COMBINED WITH 20 MG 90 capsule 3  . fluticasone (FLONASE) 50 MCG/ACT nasal spray   1  . GLUCOSAMINE-CHONDROITIN PO Take by mouth.    . hydrOXYzine (VISTARIL) 25 MG capsule TAKE 1 CAP BY MOUTH DAILY AS NEEDED TO BE  TAKE 1-3 TIMES A WEEK AS NEEDED FOR SEVERE PANIC ATTACKS 10 capsule 1  . lamoTRIgine (LAMICTAL) 150 MG tablet Take 1 tablet (150 mg total) by mouth 2 (two) times daily. 180 tablet 1  . Levothyroxine Sodium (SYNTHROID PO) Take by mouth.    . loratadine (CLARITIN REDITABS) 10 MG dissolvable tablet Take by mouth.    Marland Kitchen MILK THISTLE PO Take by mouth.    Marland Kitchen omeprazole (PRILOSEC) 40 MG capsule TAKE 1 CAPSULE BY MOUTH EVERY DAY 90 capsule 0  . QUEtiapine Fumarate (SEROQUEL XR) 150 MG 24 hr tablet Take 1 tablet (150 mg total) by mouth at bedtime. 90 tablet 1  . traZODone (DESYREL) 50 MG tablet  Take 1-3 tablets (50-150 mg total) by mouth at bedtime as needed for sleep. 270 tablet 1  . triamcinolone cream (KENALOG) 0.1 % TRIAMCINOLONE ACETONIDE, 0.1% (External Cream)  1 (one) Cream Cream use as directed for 0 days  Quantity: 30;  Refills: 1   Ordered :12-Jan-2014  Meyer Cory ;  Started 12-Jan-2014 Active    . VITAMIN D PO Take by mouth.    Marland Kitchen VITAMIN E PO Take by mouth.     Current Facility-Administered Medications  Medication Dose Route Frequency Provider Last Rate Last Admin  . 0.9 %  sodium chloride infusion  500 mL Intravenous Continuous Gatha Mayer, MD         Musculoskeletal: Strength & Muscle Tone: UTA Gait & Station: normal Patient leans: N/A  Psychiatric Specialty Exam: Review of Systems  Psychiatric/Behavioral: Negative for agitation, behavioral problems, confusion, decreased concentration, dysphoric mood, hallucinations, self-injury, sleep disturbance and suicidal ideas. The patient is not nervous/anxious and is not hyperactive.   All other systems reviewed and are negative.   There were no vitals taken for this visit.There is no height or weight on file to calculate BMI.  General Appearance: Casual  Eye Contact:  Fair  Speech:  Clear and Coherent  Volume:  Normal  Mood:  Euthymic  Affect:  Congruent  Thought Process:  Goal Directed and Descriptions of Associations:  Intact  Orientation:  Full (Time, Place, and Person)  Thought Content: Logical   Suicidal Thoughts:  No  Homicidal Thoughts:  No  Memory:  Immediate;   Fair Recent;   Fair Remote;   Fair  Judgement:  Fair  Insight:  Fair  Psychomotor Activity:  Normal  Concentration:  Concentration: Fair and Attention Span: Fair  Recall:  [employer] of Knowledge: Fair  Language: Fair  Akathisia:  No  Handed:  Right  AIMS (if indicated): UTA  Assets:  Communication Skills Desire for Improvement Housing Social Support Transportation  ADL's:  Intact  Cognition: WNL  Sleep:  Fair   Screenings: PHQ2-9     Office Visit from 12/03/2014 in [facility]  PHQ-2 Total Score  2  PHQ-9 Total Score  20       Assessment and Plan: Janei is a 69 year old Caucasian female who has a history of MDD, panic attacks, GERD, thyroid cancer in remission, osteoarthritis was evaluated by telemedicine today.  Patient is biologically predisposed given her history of medical problems as well as trauma.  Patient is currently going through psychosocial stressors of the death of her mother as well as her sister who has health issues.  Patient is currently making progress on current medication regimen and psychotherapy sessions.  Plan as noted below.  Plan MDD-in partial remission Prozac 60 mg p.o. daily Seroquel extended release 150 mg p.o. nightly Lamotrigine 150 mg p.o. twice daily Continue CBT with Ms. Alden Hipp  Panic disorder-improving Prozac as prescribed Continue CBT  Insomnia-stable Trazodone 50 to 150 mg p.o. nightly as needed  Pending labs-TSH, lipid panel, hemoglobin A1c and prolactin-patient agrees to get it faxed from her other provider.  Follow-up in clinic in 3 months or sooner if needed.  May 11 at 10:20 AM  I have spent atleast 20 minutes non face to face with patient today. More than 50 % of the time was spent for preparing to see the patient ( e.g., review of test,  records ), obtaining and to review and separately obtained history , ordering medications and test ,psychoeducation and supportive psychotherapy and  care coordination,as well as documenting clinical information in electronic health record. This note was generated in part or whole with voice recognition software. Voice recognition is usually quite accurate but there are transcription errors that can and very often do occur. I apologize for any typographical errors that were not detected and corrected.       Ursula Alert, MD 03/29/2019, 11:54 AM

## 2019-04-17 ENCOUNTER — Ambulatory Visit (INDEPENDENT_AMBULATORY_CARE_PROVIDER_SITE_OTHER): Payer: Medicare Other | Admitting: Licensed Clinical Social Worker

## 2019-04-17 ENCOUNTER — Encounter: Payer: Self-pay | Admitting: Licensed Clinical Social Worker

## 2019-04-17 ENCOUNTER — Other Ambulatory Visit: Payer: Self-pay

## 2019-04-17 DIAGNOSIS — F3341 Major depressive disorder, recurrent, in partial remission: Secondary | ICD-10-CM

## 2019-04-17 NOTE — Progress Notes (Signed)
Virtual Visit via Telephone Note  I connected with Jamie Baxter on 04/17/19 at  1:30 PM EDT by telephone and verified that I am speaking with the correct person using two identifiers.   I discussed the limitations, risks, security and privacy concerns of performing an evaluation and management service by telephone and the availability of in person appointments. I also discussed with the patient that there may be a patient responsible charge related to this service. The patient expressed understanding and agreed to proceed.  I discussed the assessment and treatment plan with the patient. The patient was provided an opportunity to ask questions and all were answered. The patient agreed with the plan and demonstrated an understanding of the instructions.   The patient was advised to call back or seek an in-person evaluation if the symptoms worsen or if the condition fails to improve as anticipated.  I provided 30 minutes of non-face-to-face time during this encounter.   Alden Hipp, LCSW   THERAPIST PROGRESS NOTE  Session Time: 1330 Participation Level: Active  Behavioral Response: CasualAlertAnxious  Type of Therapy: Individual Therapy  Treatment Goals addressed: Coping  Interventions: Supportive  Summary: Jamie Baxter is a 69 y.o. female who presents with continued symptoms related to her diagnosis. Jennaya reports doing really well since our last session. She reports she has continued to be able to manage anxiety in the moment, and has been able to communicate with her family members about her mental health and how she is feeling. Aldonia reported she is currently very excited because she gets to babysit her son's dog this weekend, and she loves doing that. LCSW validated Amberlyn's feelings around these situations, and validated how helpful it can be to have something to look forward to like babysitting the dog.   Glenette expressed agreement. Ramonica reports her sister is doing well and husband  has continued to do well physically as well. LCSW validated these statements and highlighted how Devena has been able to manage her anxiety/depression throughout these events. Floreine expressed agreement and understanding. Breanah reported feeling good, and thinking we can move therapy to every 3 months, but noted she would call if she needed something sooner. LCSW expressed agreement with this plan.   Suicidal/Homicidal: No  Therapist Response: Treniti continues to work towards her tx goals but has not yet reached them. We will continue to work on improving emotional regulation and distress tolerance skills moving forward.   Plan: Return again in 12  weeks.  Diagnosis: Axis I: MDD    Axis II: No diagnosis    Alden Hipp, LCSW 04/17/2019

## 2019-06-01 ENCOUNTER — Telehealth: Payer: Self-pay

## 2019-06-01 DIAGNOSIS — F331 Major depressive disorder, recurrent, moderate: Secondary | ICD-10-CM

## 2019-06-01 DIAGNOSIS — F410 Panic disorder [episodic paroxysmal anxiety] without agoraphobia: Secondary | ICD-10-CM

## 2019-06-01 NOTE — Telephone Encounter (Signed)
pt called states she needs a refill on her hydroxyzine please send to [facility]

## 2019-06-02 MED ORDER — HYDROXYZINE PAMOATE 25 MG PO CAPS: ORAL_CAPSULE | ORAL | 1 refills | Status: DC

## 2019-06-02 NOTE — Telephone Encounter (Signed)
I have sent hydroxyzine to [facility].

## 2019-06-13 ENCOUNTER — Telehealth (INDEPENDENT_AMBULATORY_CARE_PROVIDER_SITE_OTHER): Payer: Medicare Other | Admitting: Psychiatry

## 2019-06-13 ENCOUNTER — Other Ambulatory Visit: Payer: Self-pay

## 2019-06-13 ENCOUNTER — Encounter: Payer: Self-pay | Admitting: Psychiatry

## 2019-06-13 DIAGNOSIS — F3342 Major depressive disorder, recurrent, in full remission: Secondary | ICD-10-CM | POA: Diagnosis not present

## 2019-06-13 DIAGNOSIS — F410 Panic disorder [episodic paroxysmal anxiety] without agoraphobia: Secondary | ICD-10-CM

## 2019-06-13 DIAGNOSIS — G4700 Insomnia, unspecified: Secondary | ICD-10-CM

## 2019-06-13 MED ORDER — TRAZODONE HCL 100 MG PO TABS: 150.0000 mg | ORAL_TABLET | Freq: Every evening | ORAL | 1 refills | Status: DC | PRN

## 2019-06-13 MED ORDER — QUETIAPINE FUMARATE ER 50 MG PO TB24: 100.0000 mg | ORAL_TABLET | Freq: Every day | ORAL | 0 refills | Status: DC

## 2019-06-13 MED ORDER — LAMOTRIGINE 150 MG PO TABS: 150.0000 mg | ORAL_TABLET | Freq: Two times a day (BID) | ORAL | 1 refills | Status: DC

## 2019-06-13 NOTE — Progress Notes (Signed)
Provider Location : [facility] Patient Location : [facility] Visit via Video Note  I connected with Jamie Baxter on 06/13/19 at 10:20 AM EDT by a video enabled telemedicine application and verified that I am speaking with the correct person using two identifiers.   I discussed the limitations of evaluation and management by telemedicine and the availability of in person appointments. The patient expressed understanding and agreed to proceed.   I discussed the assessment and treatment plan with the patient. The patient was provided an opportunity to ask questions and all were answered. The patient agreed with the plan and demonstrated an understanding of the instructions.   The patient was advised to call back or seek an in-person evaluation if the symptoms worsen or if the condition fails to improve as anticipated.   [facility] MD OP Progress Note  06/13/2019 12:27 PM Jamie Baxter  MRN:  [id number]  Chief Complaint:  Chief Complaint    Follow-up     HPI: Jamie Baxter is a 69 year old Caucasian female, married, retired, lives in [place], has a history of MDD, panic disorder, osteoarthritis, history of thyroid cancer, GERD was evaluated by telemedicine today.  Patient today reports she is currently struggling with psychosocial stressors.  She reports her friend was giving her a lot of trouble by calling her all the time for help.  Patient reports she got frustrated with this and then her husband had to step in and talk to this friend.  This friend recently lost her son.  Patient reports this hence has been very difficult.  She reports however she has been coping okay so far.  Patient reports she does struggle with sleep once every 6 weeks or so.  She however reports she uses her hydroxyzine as needed at night when she has sleep issues.  This does help her.  Overall her sleep has improved.  She describes her depression and anxiety as stable.  She is compliant on medications as prescribed.  She  denies any side effects to the medications.  Patient denies any suicidality, homicidality or perceptual disturbances.  Patient denies any other concerns today.  Visit Diagnosis:    ICD-10-CM   1. MDD (major depressive disorder), recurrent, in full remission ([place])  F33.42 QUEtiapine (SEROQUEL XR) 50 MG TB24 24 hr tablet    traZODone (DESYREL) 100 MG tablet    lamoTRIgine (LAMICTAL) 150 MG tablet  2. Panic disorder  F41.0     Past Psychiatric History: I have reviewed past psychiatric history from my progress note on 08/11/2018.  Past trials of Pristiq, Prozac, Lamictal, Seroquel  Past Medical History:  Past Medical History:  Diagnosis Date  . Anxiety   . Depression   . GERD (gastroesophageal reflux disease)   . Thyroid disease     Past Surgical History:  Procedure Laterality Date  . ABSCESS DRAINAGE    . BREAST BIOPSY    . BREAST SURGERY     breast biopsy  . COLONOSCOPY  2006, 07/17/10   Normal (except melanosis), ? hx polyps in 1990's - [place]  . rotator cuff    . THYROID LOBECTOMY  1978   goiter  . THYROIDECTOMY SUBSTERNAL  2010   left side/cancer/radioactive iodine 2 years in a row for Ca cells  . VAGINAL HYSTERECTOMY     fiboid tumors    Family Psychiatric History: I have reviewed family psychiatric history from my progress note on 08/11/2018  Family History:  Family History  Problem Relation Age of Onset  .  GER disease Father   . Rheum arthritis Sister   . Healthy Sister   . Hypertension Mother   . Mental illness Neg Hx     Social History: Reviewed social history from my progress note on 08/11/2018 Social History   Socioeconomic History  . Marital status: Married    Spouse name: monroe  . Number of children: 1  . Years of education: Not on file  . Highest education level: High school graduate  Occupational History  . Not on file  Tobacco Use  . Smoking status: Former Smoker    Types: Cigarettes    Quit date: 02/03/1976    Years since quitting: 43.3  .  Smokeless tobacco: Never Used  Substance and Sexual Activity  . Alcohol use: No  . Drug use: No  . Sexual activity: Not Currently  Other Topics Concern  . Not on file  Social History Narrative  . Not on file   Social Determinants of Health   Financial Resource Strain: Low Risk   . Difficulty of Paying Living Expenses: Not hard at all  Food Insecurity: No Food Insecurity  . Worried About Charity fundraiser in the Last Year: Never true  . Ran Out of Food in the Last Year: Never true  Transportation Needs: No Transportation Needs  . Lack of Transportation (Medical): No  . Lack of Transportation (Non-Medical): No  Physical Activity: Insufficiently Active  . Days of Exercise per Week: 5 days  . Minutes of Exercise per Session: 20 min  Stress: No Stress Concern Present  . Feeling of Stress : Not at all  Social Connections: Unknown  . Frequency of Communication with Friends and Family: Not on file  . Frequency of Social Gatherings with Friends and Family: Not on file  . Attends Religious Services: More than 4 times per year  . Active Member of Clubs or Organizations: No  . Attends Archivist Meetings: Never  . Marital Status: Married    Allergies:  Allergies  Allergen Reactions  . Amantadine Other (See Comments)    disorientation  . Benztropine   . Geodon  [Ziprasidone Hcl] Other (See Comments)    disorientation  . Sulfa Antibiotics     Metabolic Disorder Labs: No results found for: HGBA1C, MPG No results found for: PROLACTIN No results found for: CHOL, TRIG, HDL, CHOLHDL, VLDL, LDLCALC No results found for: TSH  Therapeutic Level Labs: No results found for: LITHIUM No results found for: VALPROATE No components found for:  CBMZ  Current Medications: Current Outpatient Medications  Medication Sig Dispense Refill  . B Complex Vitamins (B COMPLEX PO) Take by mouth.    . Coenzyme Q10 (COQ10 PO) Take by mouth.    . etodolac (LODINE) 400 MG tablet Take by  mouth.    Marland Kitchen FLUoxetine (PROZAC) 20 MG capsule Take 1 capsule (20 mg total) by mouth daily. To be combined with 40 mg 90 capsule 2  . FLUoxetine (PROZAC) 40 MG capsule Take 1 capsule (40 mg total) by mouth daily. TO BE COMBINED WITH 20 MG 90 capsule 3  . fluticasone (FLONASE) 50 MCG/ACT nasal spray   1  . GLUCOSAMINE-CHONDROITIN PO Take by mouth.    . hydrOXYzine (VISTARIL) 25 MG capsule TAKE 1 CAP BY MOUTH DAILY AS NEEDED TO BE TAKE 1-3 TIMES A WEEK AS NEEDED FOR SEVERE PANIC ATTACKS 45 capsule 1  . lamoTRIgine (LAMICTAL) 150 MG tablet Take 1 tablet (150 mg total) by mouth 2 (two) times daily. [place]  tablet 1  . Levothyroxine Sodium (SYNTHROID PO) Take by mouth.    . loratadine (CLARITIN REDITABS) 10 MG dissolvable tablet Take by mouth.    Marland Kitchen MILK THISTLE PO Take by mouth.    Marland Kitchen omeprazole (PRILOSEC) 40 MG capsule TAKE 1 CAPSULE BY MOUTH EVERY DAY 90 capsule 0  . QUEtiapine (SEROQUEL XR) 50 MG TB24 24 hr tablet Take 2 tablets (100 mg total) by mouth at bedtime. 180 tablet 0  . traZODone (DESYREL) 100 MG tablet Take 1.5-2 tablets (150-200 mg total) by mouth at bedtime as needed for sleep. 180 tablet 1  . triamcinolone cream (KENALOG) 0.1 % TRIAMCINOLONE ACETONIDE, 0.1% (External Cream)  1 (one) Cream Cream use as directed for 0 days  Quantity: 30;  Refills: 1   Ordered :12-Jan-2014  Meyer Cory ;  Started 12-Jan-2014 Active    . VITAMIN D PO Take by mouth.    Marland Kitchen VITAMIN E PO Take by mouth.     Current Facility-Administered Medications  Medication Dose Route Frequency Provider Last Rate Last Admin  . 0.9 %  sodium chloride infusion  500 mL Intravenous Continuous Gatha Mayer, MD         Musculoskeletal: Strength & Muscle Tone: [place]: Observed as seated Patient leans: N/A  Psychiatric Specialty Exam: Review of Systems  Psychiatric/Behavioral: Negative for agitation, behavioral problems, confusion, decreased concentration, dysphoric mood, hallucinations, self-injury,  sleep disturbance and suicidal ideas. The patient is not nervous/anxious and is not hyperactive.   All other systems reviewed and are negative.   There were no vitals taken for this visit.There is no height or weight on file to calculate BMI.  General Appearance: Casual  Eye Contact:  Fair  Speech:  Clear and Coherent  Volume:  Normal  Mood:  Euthymic  Affect:  Appropriate  Thought Process:  Goal Directed and Descriptions of Associations: Intact  Orientation:  Full (Time, Place, and Person)  Thought Content: Logical   Suicidal Thoughts:  No  Homicidal Thoughts:  No  Memory:  Immediate;   Fair Recent;   Fair Remote;   Fair  Judgement:  Fair  Insight:  Fair  Psychomotor Activity:  Normal  Concentration:  Concentration: Fair and Attention Span: Fair  Recall:  [employer] of Knowledge: Fair  Language: Fair  Akathisia:  No  Handed:  Right  AIMS (if indicated): UTA  Assets:  Communication Skills Desire for Improvement Housing Social Support  ADL's:  Intact  Cognition: WNL  Sleep:  Fair   Screenings: PHQ2-9     Video Visit from 06/13/2019 in [facility] Office Visit from 12/03/2014 in [facility]  PHQ-2 Total Score  0  2  PHQ-9 Total Score  4  20       Assessment and Plan: ZYIA HUICOCHEA is a 69 year old Caucasian female who has a history of MDD, panic attacks, GERD, thyroid cancer in remission, osteoarthritis was evaluated by telemedicine today.  Patient is biologically predisposed given her history of medical problems as well as trauma.  Patient is currently stable on current medication regimen.  She does have psychosocial stressors of relationship struggles however she is coping okay.  Since her symptoms are currently stable will try to taper her gradually off of the Seroquel.  This was discussed with patient and she is agreeable.  Plan as noted below.  Plan MDD in remission PHQ 9 today equals 4 Continue Prozac 60 mg p.o.  daily Lamotrigine 150 mg p.o. twice daily  Reduce Seroquel extended release to 100 mg p.o. nightly Increase trazodone to 150 to 200 mg p.o. nightly since the Seroquel is being tapered down.  This is for her sleep. Continue CBT as needed.  Panic disorder-stable Prozac as prescribed Continue CBT as needed  Insomnia-stable Increase trazodone to 150 to 200 mg p.o. nightly as needed since trazodone is being tapered down. She can also use hydroxyzine as needed, which she uses sporadically.  Follow-up in clinic in 6 weeks or sooner if needed.  I have spent atleast 20 minutes non face to face with patient today. More than 50 % of the time was spent for preparing to see the patient ( e.g., review of test, records ), ordering medications and test ,psychoeducation and supportive psychotherapy and care coordination,as well as documenting clinical information in electronic health record. This note was generated in part or whole with voice recognition software. Voice recognition is usually quite accurate but there are transcription errors that can and very often do occur. I apologize for any typographical errors that were not detected and corrected.        Ursula Alert, MD 06/13/2019, 12:27 PM

## 2019-07-19 ENCOUNTER — Ambulatory Visit: Payer: Medicare Other | Admitting: Licensed Clinical Social Worker

## 2019-07-24 ENCOUNTER — Encounter: Payer: Self-pay | Admitting: Psychiatry

## 2019-07-24 ENCOUNTER — Telehealth (INDEPENDENT_AMBULATORY_CARE_PROVIDER_SITE_OTHER): Payer: Medicare Other | Admitting: Psychiatry

## 2019-07-24 ENCOUNTER — Other Ambulatory Visit: Payer: Self-pay

## 2019-07-24 DIAGNOSIS — F410 Panic disorder [episodic paroxysmal anxiety] without agoraphobia: Secondary | ICD-10-CM | POA: Diagnosis not present

## 2019-07-24 DIAGNOSIS — F3342 Major depressive disorder, recurrent, in full remission: Secondary | ICD-10-CM | POA: Diagnosis not present

## 2019-07-24 NOTE — Progress Notes (Signed)
Provider Location : [facility] Patient Location : Home  Virtual Visit via Video Note  I connected with Jamie Baxter on 07/24/19 at 11:20 AM EDT by a video enabled telemedicine application and verified that I am speaking with the correct person using two identifiers.   I discussed the limitations of evaluation and management by telemedicine and the availability of in person appointments. The patient expressed understanding and agreed to proceed.     I discussed the assessment and treatment plan with the patient. The patient was provided an opportunity to ask questions and all were answered. The patient agreed with the plan and demonstrated an understanding of the instructions.   The patient was advised to call back or seek an in-person evaluation if the symptoms worsen or if the condition fails to improve as anticipated.  [place] MD OP Progress Note  07/24/2019 9:14 PM BRIEA MCENERY  MRN:  [id number]  Chief Complaint:  Chief Complaint    Follow-up     HPI: Jamie Baxter is a 69 year old Caucasian female, married, retired, lives in [place], has a history of MDD, panic disorder, osteoarthritis, history of thyroid cancer, GERD was evaluated by telemedicine today.  Patient today reports she is currently doing well with regards to her mood symptoms.  She denies any depressive symptoms.  She denies any panic attacks.  She reports sleep is overall better on the current medication regimen.  She takes the trazodone 100 mg orally most of the nights.  She however reports there are days when she wakes up groggy in the morning however that does not last too long.  She does not want any medication changes today for the same since she is able to cope with it.  She denies any suicidality, homicidality or perceptual disturbances.  She is compliant on medications.  She reports she does have situational stressors of her friend who passed away, and is a friend who is hospitalized and her sister who had COVID 37  infection recently.  She however has been able to cope with it.  She is interested in restarting psychotherapy sessions and will reach out to the front desk to schedule an appointment with our new therapist.  Patient denies any other concerns today.  Visit Diagnosis:    ICD-10-CM   1. MDD (major depressive disorder), recurrent, in full remission ([place])  F33.42   2. Panic disorder  F41.0     Past Psychiatric History: I have reviewed past psychiatric history from my progress note on 08/11/2018.  Past trials of Pristiq, Prozac, Lamictal, Seroquel  Past Medical History:  Past Medical History:  Diagnosis Date  . Anxiety   . Depression   . GERD (gastroesophageal reflux disease)   . Thyroid disease     Past Surgical History:  Procedure Laterality Date  . ABSCESS DRAINAGE    . BREAST BIOPSY    . BREAST SURGERY     breast biopsy  . COLONOSCOPY  2006, 07/17/10   Normal (except melanosis), ? hx polyps in 1990's - [place]  . rotator cuff    . THYROID LOBECTOMY  1978   goiter  . THYROIDECTOMY SUBSTERNAL  2010   left side/cancer/radioactive iodine 2 years in a row for Ca cells  . VAGINAL HYSTERECTOMY     fiboid tumors    Family Psychiatric History: I have reviewed family psychiatric history from my progress note on 08/11/2018  Family History:  Family History  Problem Relation Age of Onset  . GER disease Father   .  Rheum arthritis Sister   . Healthy Sister   . Hypertension Mother   . Mental illness Neg Hx     Social History: I have reviewed social history from my progress note on 08/11/2018 Social History   Socioeconomic History  . Marital status: Married    Spouse name: monroe  . Number of children: 1  . Years of education: Not on file  . Highest education level: High school graduate  Occupational History  . Not on file  Tobacco Use  . Smoking status: Former Smoker    Types: Cigarettes    Quit date: 02/03/1976    Years since quitting: 43.4  . Smokeless tobacco: Never Used   Vaping Use  . Vaping Use: Never used  Substance and Sexual Activity  . Alcohol use: No  . Drug use: No  . Sexual activity: Not Currently  Other Topics Concern  . Not on file  Social History Narrative  . Not on file   Social Determinants of Health   Financial Resource Strain: Low Risk   . Difficulty of Paying Living Expenses: Not hard at all  Food Insecurity: No Food Insecurity  . Worried About Charity fundraiser in the Last Year: Never true  . Ran Out of Food in the Last Year: Never true  Transportation Needs:   . Lack of Transportation (Medical):   Marland Kitchen Lack of Transportation (Non-Medical):   Physical Activity:   . Days of Exercise per Week:   . Minutes of Exercise per Session:   Stress:   . Feeling of Stress :   Social Connections:   . Frequency of Communication with Friends and Family:   . Frequency of Social Gatherings with Friends and Family:   . Attends Religious Services:   . Active Member of Clubs or Organizations:   . Attends Archivist Meetings:   Marland Kitchen Marital Status:     Allergies:  Allergies  Allergen Reactions  . Amantadine Other (See Comments)    disorientation  . Benztropine   . Geodon  [Ziprasidone Hcl] Other (See Comments)    disorientation  . Sulfa Antibiotics     Metabolic Disorder Labs: No results found for: HGBA1C, MPG No results found for: PROLACTIN No results found for: CHOL, TRIG, HDL, CHOLHDL, VLDL, LDLCALC No results found for: TSH  Therapeutic Level Labs: No results found for: LITHIUM No results found for: VALPROATE No components found for:  CBMZ  Current Medications: Current Outpatient Medications  Medication Sig Dispense Refill  . B Complex Vitamins (B COMPLEX PO) Take by mouth.    . Coenzyme Q10 (COQ10 PO) Take by mouth.    . etodolac (LODINE) 400 MG tablet Take by mouth.    Marland Kitchen FLUoxetine (PROZAC) 20 MG capsule Take 1 capsule (20 mg total) by mouth daily. To be combined with 40 mg 90 capsule 2  . FLUoxetine (PROZAC)  40 MG capsule Take 1 capsule (40 mg total) by mouth daily. TO BE COMBINED WITH 20 MG 90 capsule 3  . fluticasone (FLONASE) 50 MCG/ACT nasal spray   1  . GLUCOSAMINE-CHONDROITIN PO Take by mouth.    . hydrOXYzine (VISTARIL) 25 MG capsule TAKE 1 CAP BY MOUTH DAILY AS NEEDED TO BE TAKE 1-3 TIMES A WEEK AS NEEDED FOR SEVERE PANIC ATTACKS 45 capsule 1  . lamoTRIgine (LAMICTAL) 150 MG tablet Take 1 tablet (150 mg total) by mouth 2 (two) times daily. 180 tablet 1  . Levothyroxine Sodium (SYNTHROID PO) Take by mouth.    Marland Kitchen  loratadine (CLARITIN REDITABS) 10 MG dissolvable tablet Take by mouth.    Marland Kitchen MILK THISTLE PO Take by mouth.    Marland Kitchen omeprazole (PRILOSEC) 40 MG capsule TAKE 1 CAPSULE BY MOUTH EVERY DAY 90 capsule 0  . QUEtiapine (SEROQUEL XR) 50 MG TB24 24 hr tablet Take 2 tablets (100 mg total) by mouth at bedtime. 180 tablet 0  . traZODone (DESYREL) 100 MG tablet Take 1.5-2 tablets (150-200 mg total) by mouth at bedtime as needed for sleep. 180 tablet 1  . triamcinolone cream (KENALOG) 0.1 % TRIAMCINOLONE ACETONIDE, 0.1% (External Cream)  1 (one) Cream Cream use as directed for 0 days  Quantity: 30;  Refills: 1   Ordered :12-Jan-2014  Meyer Cory ;  Started 12-Jan-2014 Active    . VITAMIN D PO Take by mouth.    Marland Kitchen VITAMIN E PO Take by mouth.     Current Facility-Administered Medications  Medication Dose Route Frequency Provider Last Rate Last Admin  . 0.9 %  sodium chloride infusion  500 mL Intravenous Continuous Gatha Mayer, MD         Musculoskeletal: Strength & Muscle Tone: UTA Gait & Station: normal Patient leans: N/A  Psychiatric Specialty Exam: Review of Systems  Psychiatric/Behavioral: Negative for agitation, behavioral problems, confusion, decreased concentration, dysphoric mood, hallucinations, self-injury, sleep disturbance and suicidal ideas. The patient is not nervous/anxious and is not hyperactive.   All other systems reviewed and are negative.   There were no  vitals taken for this visit.There is no height or weight on file to calculate BMI.  General Appearance: Casual  Eye Contact:  Fair  Speech:  Clear and Coherent  Volume:  Normal  Mood:  Euthymic  Affect:  Congruent  Thought Process:  Goal Directed and Descriptions of Associations: Intact  Orientation:  Full (Time, Place, and Person)  Thought Content: Logical   Suicidal Thoughts:  No  Homicidal Thoughts:  No  Memory:  Immediate;   Fair Recent;   Fair Remote;   Fair  Judgement:  Fair  Insight:  Fair  Psychomotor Activity:  Normal  Concentration:  Concentration: Fair and Attention Span: Fair  Recall:  [employer] of Knowledge: Fair  Language: Fair  Akathisia:  No  Handed:  Right  AIMS (if indicated): UTA  Assets:  Communication Skills Desire for Improvement Social Support  ADL's:  Intact  Cognition: WNL  Sleep:  Fair   Screenings: PHQ2-9     Video Visit from 06/13/2019 in [place] Office Visit from 12/03/2014 in [facility]  PHQ-2 Total Score 0 2  PHQ-9 Total Score 4 20       Assessment and Plan: STPEHANIE MONTROY is a 69 year old Caucasian female who has a history of MDD, panic attacks, GERD, thyroid cancer in remission, osteoarthritis was evaluated by telemedicine today.  Patient is biologically predisposed given her history of medical problems as well as trauma.  Patient is currently stable on current medication regimen.  Plan as noted below.  Plan MDD in remission Prozac 60 mg p.o. daily Lamotrigine 150 mg p.o. twice daily Seroquel extended release 100 mg p.o. nightly-reduced dosage. Trazodone 150 to 200 mg p.o. nightly as needed.  Patient however has been using 100 mg p.o. nightly most nights. She could continue to do so.   Panic attacks-stable Prozac as prescribed Patient advised to restart CBT as needed.  Insomnia-stable Trazodone 150 to 200 mg p.o. nightly as needed She does have hydroxyzine as needed available  which she  uses sporadically.  Follow-up in clinic in 3 months or sooner if needed.  I have spent atleast 20 minutes non face to face with patient today. More than 50 % of the time was spent for preparing to see the patient ( e.g., review of test, records ), ordering medications and test ,psychoeducation and supportive psychotherapy and care coordination,as well as documenting clinical information in electronic health record. This note was generated in part or whole with voice recognition software. Voice recognition is usually quite accurate but there are transcription errors that can and very often do occur. I apologize for any typographical errors that were not detected and corrected.      Ursula Alert, MD 07/24/2019, 9:14 PM

## 2019-08-14 ENCOUNTER — Other Ambulatory Visit: Payer: Self-pay | Admitting: Psychiatry

## 2019-08-14 DIAGNOSIS — F3342 Major depressive disorder, recurrent, in full remission: Secondary | ICD-10-CM

## 2019-10-23 ENCOUNTER — Telehealth (INDEPENDENT_AMBULATORY_CARE_PROVIDER_SITE_OTHER): Payer: Medicare Other | Admitting: Psychiatry

## 2019-10-23 ENCOUNTER — Other Ambulatory Visit: Payer: Self-pay

## 2019-10-23 DIAGNOSIS — F3342 Major depressive disorder, recurrent, in full remission: Secondary | ICD-10-CM

## 2019-10-23 DIAGNOSIS — F410 Panic disorder [episodic paroxysmal anxiety] without agoraphobia: Secondary | ICD-10-CM

## 2019-10-23 NOTE — Progress Notes (Signed)
No response to call or text or video invite  

## 2019-10-25 ENCOUNTER — Other Ambulatory Visit: Payer: Self-pay

## 2019-10-25 ENCOUNTER — Telehealth (INDEPENDENT_AMBULATORY_CARE_PROVIDER_SITE_OTHER): Payer: Medicare Other | Admitting: Psychiatry

## 2019-10-25 ENCOUNTER — Encounter: Payer: Self-pay | Admitting: Psychiatry

## 2019-10-25 DIAGNOSIS — F410 Panic disorder [episodic paroxysmal anxiety] without agoraphobia: Secondary | ICD-10-CM

## 2019-10-25 DIAGNOSIS — F330 Major depressive disorder, recurrent, mild: Secondary | ICD-10-CM

## 2019-10-25 MED ORDER — QUETIAPINE FUMARATE ER 50 MG PO TB24: 100.0000 mg | ORAL_TABLET | Freq: Every day | ORAL | 1 refills | Status: DC

## 2019-10-25 NOTE — Progress Notes (Signed)
Provider Location : [facility] Patient Location : Home  Participants: Patient , Provider  Virtual Visit via Video Note  I connected with Jamie Baxter on 10/25/19 at 10:40 AM EDT by a video enabled telemedicine application and verified that I am speaking with the correct person using two identifiers.   I discussed the limitations of evaluation and management by telemedicine and the availability of in person appointments. The patient expressed understanding and agreed to proceed.    I discussed the assessment and treatment plan with the patient. The patient was provided an opportunity to ask questions and all were answered. The patient agreed with the plan and demonstrated an understanding of the instructions.   The patient was advised to call back or seek an in-person evaluation if the symptoms worsen or if the condition fails to improve as anticipated.  Jamie [facility] MD OP Progress Note  10/25/2019 11:43 AM Jamie Baxter  MRN:  [id number]  Chief Complaint:  Chief Complaint    Follow-up     HPI: Jamie Baxter is a 69 year old Caucasian female, married, retired, lives in [place], has a history of MDD, panic disorder, osteoarthritis, history of thyroid cancer, GERD was evaluated by telemedicine today.  Patient presented late for her appointment today.  Patient reports she had an adverse reaction to azithromycin few weeks ago.  She reports she was prescribed that for an upper respiratory infection.  She however reports she started hallucinating and felt weird and hence had to stop taking it the next day.  Her symptoms continued for a week or 2 and then it went away.  She is not sure if this happened due to interaction with her psychotropic medications.  She reports she did not stop any of her psychotropic medications and continued the same.  She reports she continues to be stressed out often and feels overwhelmed.  She also reports sleep is restless.  She has arthritis pain.  She also has been having  vivid dreams and her husband has observed her to be very restless and talking in her sleep.  This may have been going on since the past 3 weeks.  Patient denies any suicidality, homicidality or perceptual disturbances.  Patient denies any other concerns today.  Visit Diagnosis:    ICD-10-CM   1. MDD (major depressive disorder), recurrent episode, mild ([facility])  F33.0 QUEtiapine (SEROQUEL XR) 50 MG TB24 24 hr tablet  2. Panic disorder  F41.0     Past Psychiatric History: I have reviewed past psychiatric history from my progress note on 08/11/2018.  Past trials of Pristiq, Prozac, Lamictal, Seroquel  Past Medical History:  Past Medical History:  Diagnosis Date  . Anxiety   . Depression   . GERD (gastroesophageal reflux disease)   . Thyroid disease     Past Surgical History:  Procedure Laterality Date  . ABSCESS DRAINAGE    . BREAST BIOPSY    . BREAST SURGERY     breast biopsy  . COLONOSCOPY  2006, 07/17/10   Normal (except melanosis), ? hx polyps in 1990's - [place]  . rotator cuff    . THYROID LOBECTOMY  1978   goiter  . THYROIDECTOMY SUBSTERNAL  2010   left side/cancer/radioactive iodine 2 years in a row for Ca cells  . VAGINAL HYSTERECTOMY     fiboid tumors    Family Psychiatric History: I have reviewed family psychiatric history from my progress note on 08/11/2018.  Family History:  Family History  Problem Relation Age of Onset  .  GER disease Father   . Rheum arthritis Sister   . Healthy Sister   . Hypertension Mother   . Mental illness Neg Hx     Social History: Reviewed social history from my progress note on 08/11/2018. Social History   Socioeconomic History  . Marital status: Married    Spouse name: monroe  . Number of children: 1  . Years of education: Not on file  . Highest education level: High school graduate  Occupational History  . Not on file  Tobacco Use  . Smoking status: Former Smoker    Types: Cigarettes    Quit date: 02/03/1976    Years since  quitting: 43.7  . Smokeless tobacco: Never Used  Vaping Use  . Vaping Use: Never used  Substance and Sexual Activity  . Alcohol use: No  . Drug use: No  . Sexual activity: Not Currently  Other Topics Concern  . Not on file  Social History Narrative  . Not on file   Social Determinants of Health   Financial Resource Strain:   . Difficulty of Paying Living Expenses: Not on file  Food Insecurity:   . Worried About Charity fundraiser in the Last Year: Not on file  . Ran Out of Food in the Last Year: Not on file  Transportation Needs:   . Lack of Transportation (Medical): Not on file  . Lack of Transportation (Non-Medical): Not on file  Physical Activity:   . Days of Exercise per Week: Not on file  . Minutes of Exercise per Session: Not on file  Stress:   . Feeling of Stress : Not on file  Social Connections:   . Frequency of Communication with Friends and Family: Not on file  . Frequency of Social Gatherings with Friends and Family: Not on file  . Attends Religious Services: Not on file  . Active Member of Clubs or Organizations: Not on file  . Attends Archivist Meetings: Not on file  . Marital Status: Not on file    Allergies:  Allergies  Allergen Reactions  . Amantadine Other (See Comments)    disorientation  . Benztropine   . Geodon  [Ziprasidone Hcl] Other (See Comments)    disorientation  . Sulfa Antibiotics   . Z-Pak [Azithromycin]     hallucinations    Metabolic Disorder Labs: No results found for: HGBA1C, MPG No results found for: PROLACTIN No results found for: CHOL, TRIG, HDL, CHOLHDL, VLDL, LDLCALC No results found for: TSH  Therapeutic Level Labs: No results found for: LITHIUM No results found for: VALPROATE No components found for:  CBMZ  Current Medications: Current Outpatient Medications  Medication Sig Dispense Refill  . B Complex Vitamins (B COMPLEX PO) Take by mouth.    . Coenzyme Q10 (COQ10 PO) Take by mouth.    . etodolac  (LODINE) 400 MG tablet Take by mouth.    Marland Kitchen FLUoxetine (PROZAC) 20 MG capsule Take 1 capsule (20 mg total) by mouth daily. To be combined with 40 mg 90 capsule 2  . FLUoxetine (PROZAC) 40 MG capsule Take 1 capsule (40 mg total) by mouth daily. TO BE COMBINED WITH 20 MG 90 capsule 3  . Fluoxetine HCl, PMDD, 20 MG TABS Take by mouth.    . fluticasone (FLONASE) 50 MCG/ACT nasal spray   1  . GLUCOSAMINE-CHONDROITIN PO Take by mouth.    Marland Kitchen guaiFENesin (MUCINEX) 600 MG 12 hr tablet Take by mouth.    . hydrOXYzine (VISTARIL) 25 MG  capsule TAKE 1 CAP BY MOUTH DAILY AS NEEDED TO BE TAKE 1-3 TIMES A WEEK AS NEEDED FOR SEVERE PANIC ATTACKS 45 capsule 1  . lamoTRIgine (LAMICTAL) 150 MG tablet Take 1 tablet (150 mg total) by mouth 2 (two) times daily. 180 tablet 1  . levothyroxine (SYNTHROID) 175 MCG tablet Take 150 mcg by mouth.     . Levothyroxine Sodium (SYNTHROID PO) Take by mouth.    . loratadine (CLARITIN REDITABS) 10 MG dissolvable tablet Take by mouth.    Marland Kitchen MILK THISTLE PO Take by mouth.    Marland Kitchen omeprazole (PRILOSEC) 40 MG capsule TAKE 1 CAPSULE BY MOUTH EVERY DAY 90 capsule 0  . QUEtiapine (SEROQUEL XR) 50 MG TB24 24 hr tablet Take 2 tablets (100 mg total) by mouth at bedtime. 180 tablet 1  . SM CALCIUM 600/VITAMIN D 600-400 MG-UNIT tablet Take 1 tablet by mouth 2 (two) times daily.    Marland Kitchen SYNTHROID 150 MCG tablet Take 150 mcg by mouth daily.    . traZODone (DESYREL) 100 MG tablet Take 1.5-2 tablets (150-200 mg total) by mouth at bedtime as needed for sleep. 180 tablet 1  . triamcinolone cream (KENALOG) 0.1 % TRIAMCINOLONE ACETONIDE, 0.1% (External Cream)  1 (one) Cream Cream use as directed for 0 days  Quantity: 30;  Refills: 1   Ordered :12-Jan-2014  Meyer Cory ;  Started 12-Jan-2014 Active    . VITAMIN D PO Take by mouth.    Marland Kitchen VITAMIN E PO Take by mouth.     Current Facility-Administered Medications  Medication Dose Route Frequency Provider Last Rate Last Admin  . 0.9 %  sodium chloride  infusion  500 mL Intravenous Continuous Gatha Mayer, MD         Musculoskeletal: Strength & Muscle Tone: UTA Gait & Station: normal Patient leans: N/A  Psychiatric Specialty Exam: Review of Systems  Musculoskeletal: Positive for arthralgias.  Psychiatric/Behavioral: Positive for dysphoric mood and sleep disturbance.  All other systems reviewed and are negative.   There were no vitals taken for this visit.There is no height or weight on file to calculate BMI.  General Appearance: Casual  Eye Contact:  Fair  Speech:  Normal Rate  Volume:  Normal  Mood:  Dysphoric  Affect:  Congruent  Thought Process:  Goal Directed and Descriptions of Associations: Intact  Orientation:  Full (Time, Place, and Person)  Thought Content: Logical   Suicidal Thoughts:  No  Homicidal Thoughts:  No  Memory:  Immediate;   Fair Recent;   Fair Remote;   Fair  Judgement:  Fair  Insight:  Fair  Psychomotor Activity:  Normal  Concentration:  Concentration: Fair and Attention Span: Fair  Recall:  [employer] of Knowledge: Fair  Language: Fair  Akathisia:  No  Handed:  Right  AIMS (if indicated): UTA  Assets:  Communication Skills Desire for Improvement Housing Social Support  ADL's:  Intact  Cognition: WNL  Sleep:  Poor   Screenings: PHQ2-9     Video Visit from 06/13/2019 in [place] Office Visit from 12/03/2014 in [facility]  PHQ-2 Total Score 0 2  PHQ-9 Total Score 4 20       Assessment and Plan: MARCELL PFEIFER is a 69 year old Caucasian female who has a history of MDD, panic attacks, GERD, thyroid cancer in remission, osteoarthritis was evaluated by telemedicine today.  Patient is biologically predisposed given her history of medical problems as well as trauma.  Patient continues to struggle with  sleep issues and depressive symptoms and will benefit from medication readjustment.  Plan as noted below.  Plan MDD-unstable Continue  Prozac 60 mg p.o. daily Continue lamotrigine 150 mg p.o. twice daily Seroquel extended release 100 mg p.o. nightly.  Patient reports even though she was prescribed 100 mg p.o. nightly she has been taking only 50 mg p.o. nightly.  Advised patient to increase the dosage. Trazodone 150 to 200 mg p.o. nightly as needed for sleep. Her sleep problems are also due to her pain.  She has upcoming appointment with pain provider.  Once her pain is more under control and if she continues to have sleep issues we will consider changing her sleep medication.   Panic attacks-stable Prozac as prescribed. Patient was advised to restart CBT as needed.  Insomnia-unstable Continue trazodone 150 to 200 mg p.o. nightly as needed Continue hydroxyzine as needed which she takes sporadically. Patient was advised to increase her Seroquel extended release to 100 mg p.o. nightly.  This will help her with her sleep as well.  Follow-up in clinic in 4 to 6 weeks or sooner if needed.  I have spent atleast 20 minutes face to face with patient today. More than 50 % of the time was spent for preparing to see the patient ( e.g., review of test, records ), ordering medications and test ,psychoeducation and supportive psychotherapy and care coordination,as well as documenting clinical information in electronic health record. This note was generated in part or whole with voice recognition software. Voice recognition is usually quite accurate but there are transcription errors that can and very often do occur. I apologize for any typographical errors that were not detected and corrected.       Ursula Alert, MD 10/25/2019, 11:43 AM

## 2019-11-02 ENCOUNTER — Other Ambulatory Visit: Payer: Self-pay | Admitting: Psychiatry

## 2019-11-02 DIAGNOSIS — F3341 Major depressive disorder, recurrent, in partial remission: Secondary | ICD-10-CM

## 2019-11-02 DIAGNOSIS — F3342 Major depressive disorder, recurrent, in full remission: Secondary | ICD-10-CM

## 2019-11-02 DIAGNOSIS — F410 Panic disorder [episodic paroxysmal anxiety] without agoraphobia: Secondary | ICD-10-CM

## 2019-11-16 ENCOUNTER — Other Ambulatory Visit: Payer: Self-pay | Admitting: Psychiatry

## 2019-11-16 DIAGNOSIS — F410 Panic disorder [episodic paroxysmal anxiety] without agoraphobia: Secondary | ICD-10-CM

## 2019-11-16 DIAGNOSIS — F331 Major depressive disorder, recurrent, moderate: Secondary | ICD-10-CM

## 2019-11-21 ENCOUNTER — Other Ambulatory Visit: Payer: Self-pay | Admitting: Psychiatry

## 2019-11-21 DIAGNOSIS — F410 Panic disorder [episodic paroxysmal anxiety] without agoraphobia: Secondary | ICD-10-CM

## 2019-11-21 DIAGNOSIS — F331 Major depressive disorder, recurrent, moderate: Secondary | ICD-10-CM

## 2019-11-28 ENCOUNTER — Other Ambulatory Visit: Payer: Self-pay

## 2019-11-28 ENCOUNTER — Telehealth (INDEPENDENT_AMBULATORY_CARE_PROVIDER_SITE_OTHER): Payer: Medicare Other | Admitting: Psychiatry

## 2019-11-28 DIAGNOSIS — F330 Major depressive disorder, recurrent, mild: Secondary | ICD-10-CM

## 2019-11-28 NOTE — Progress Notes (Addendum)
I tried to connect with patient by phone call as well as texted link as well as started MyChart video.  However patient did not respond at the time of the appointment.  However at around 2:40 PM I got a message from front desk that patient has been trying to reach [place] and her calls were going into a voicemail.  Advised front desk to reschedule patient for another appointment.

## 2019-11-28 NOTE — Addendum Note (Signed)
Addended byUrsula Alert on: 11/28/2019 02:44 PM   Modules accepted: Level of Service

## 2019-12-25 ENCOUNTER — Telehealth (INDEPENDENT_AMBULATORY_CARE_PROVIDER_SITE_OTHER): Payer: Medicare Other | Admitting: Psychiatry

## 2019-12-25 ENCOUNTER — Other Ambulatory Visit: Payer: Self-pay

## 2019-12-25 ENCOUNTER — Encounter: Payer: Self-pay | Admitting: Psychiatry

## 2019-12-25 DIAGNOSIS — F330 Major depressive disorder, recurrent, mild: Secondary | ICD-10-CM | POA: Diagnosis not present

## 2019-12-25 DIAGNOSIS — F410 Panic disorder [episodic paroxysmal anxiety] without agoraphobia: Secondary | ICD-10-CM

## 2019-12-25 NOTE — Progress Notes (Signed)
Virtual Visit via Telephone Note  I connected with Jamie Baxter on 12/25/19 at  3:50 PM EST by telephone and verified that I am speaking with the correct person using two identifiers.  Location Provider Location : [facility] Patient Location : Home  Participants: Patient , Provider   I discussed the limitations, risks, security and privacy concerns of performing an evaluation and management service by telephone and the availability of in person appointments. I also discussed with the patient that there may be a patient responsible charge related to this service. The patient expressed understanding and agreed to proceed. I discussed the assessment and treatment plan with the patient. The patient was provided an opportunity to ask questions and all were answered. The patient agreed with the plan and demonstrated an understanding of the instructions.  The patient was advised to call back or seek an in-person evaluation if the symptoms worsen or if the condition fails to improve as anticipated.   [facility] MD OP Progress Note  12/25/2019 4:20 PM TAWANA PASCH  MRN:  [id number]  Chief Complaint:  Chief Complaint    Follow-up     HPI: Jamie Baxter is a 69 year old Caucasian female, married, retired, lives in [place], has a history of MDD, panic disorder, osteoarthritis, history of thyroid cancer, GERD was evaluated by telemedicine today.    Patient today reports since her last appointment with writer she has been making progress with regards to her mood.  Her depressive symptoms have improved.  She reports sleep is better.  She does wake up in between however is able to fall back asleep.  She is compliant on her medications like Seroquel and trazodone which helps.  She reports she has made a lot of lifestyle changes.  She reports she is eating healthy, she is making sure that she does not take a lot of salt, is staying well-hydrated and not eating junk food.  All this could be contributing to improved  energy level and less fatigue.  Her pain is more so under control.  She recently got steroid injection to her knee.  Patient denies any suicidality, homicidality or perceptual disturbances.  Patient denies any other concerns today.    Visit Diagnosis:    ICD-10-CM   1. MDD (major depressive disorder), recurrent episode, mild ([place])  F33.0   2. Panic disorder  F41.0     Past Psychiatric History: I have reviewed past psychiatric history from my progress note on 08/11/2018.  Past trials of Pristiq, Prozac, Lamictal, Seroquel  Past Medical History:  Past Medical History:  Diagnosis Date  . Anxiety   . Depression   . GERD (gastroesophageal reflux disease)   . Thyroid disease     Past Surgical History:  Procedure Laterality Date  . ABSCESS DRAINAGE    . BREAST BIOPSY    . BREAST SURGERY     breast biopsy  . COLONOSCOPY  2006, 07/17/10   Normal (except melanosis), ? hx polyps in 1990's - [place]  . rotator cuff    . THYROID LOBECTOMY  1978   goiter  . THYROIDECTOMY SUBSTERNAL  2010   left side/cancer/radioactive iodine 2 years in a row for Ca cells  . VAGINAL HYSTERECTOMY     fiboid tumors    Family Psychiatric History: I have reviewed family psychiatric history from my progress note on 08/11/2018  Family History:  Family History  Problem Relation Age of Onset  . GER disease Father   . Rheum arthritis Sister   .  Healthy Sister   . Hypertension Mother   . Mental illness Neg Hx     Social History: Reviewed social history from my progress note on 08/11/2018 Social History   Socioeconomic History  . Marital status: Married    Spouse name: monroe  . Number of children: 1  . Years of education: Not on file  . Highest education level: High school graduate  Occupational History  . Not on file  Tobacco Use  . Smoking status: Former Smoker    Types: Cigarettes    Quit date: 02/03/1976    Years since quitting: 43.9  . Smokeless tobacco: Never Used  Vaping Use  . Vaping  Use: Never used  Substance and Sexual Activity  . Alcohol use: No  . Drug use: No  . Sexual activity: Not Currently  Other Topics Concern  . Not on file  Social History Narrative  . Not on file   Social Determinants of Health   Financial Resource Strain:   . Difficulty of Paying Living Expenses: Not on file  Food Insecurity:   . Worried About Charity fundraiser in the Last Year: Not on file  . Ran Out of Food in the Last Year: Not on file  Transportation Needs:   . Lack of Transportation (Medical): Not on file  . Lack of Transportation (Non-Medical): Not on file  Physical Activity:   . Days of Exercise per Week: Not on file  . Minutes of Exercise per Session: Not on file  Stress:   . Feeling of Stress : Not on file  Social Connections:   . Frequency of Communication with Friends and Family: Not on file  . Frequency of Social Gatherings with Friends and Family: Not on file  . Attends Religious Services: Not on file  . Active Member of Clubs or Organizations: Not on file  . Attends Archivist Meetings: Not on file  . Marital Status: Not on file    Allergies:  Allergies  Allergen Reactions  . Amantadine Other (See Comments)    disorientation  . Benztropine   . Geodon  [Ziprasidone Hcl] Other (See Comments)    disorientation  . Sulfa Antibiotics   . Z-Pak [Azithromycin]     hallucinations    Metabolic Disorder Labs: No results found for: HGBA1C, MPG No results found for: PROLACTIN No results found for: CHOL, TRIG, HDL, CHOLHDL, VLDL, LDLCALC No results found for: TSH  Therapeutic Level Labs: No results found for: LITHIUM No results found for: VALPROATE No components found for:  CBMZ  Current Medications: Current Outpatient Medications  Medication Sig Dispense Refill  . acetaminophen (TYLENOL) 325 MG tablet Take by mouth.    . B Complex Vitamins (B COMPLEX PO) Take by mouth.    . Calcium Carbonate-Vitamin D 600-400 MG-UNIT tablet Take by mouth.     . Coenzyme Q10 (COQ10 PO) Take by mouth.    . etodolac (LODINE) 400 MG tablet Take by mouth.    Marland Kitchen FLUoxetine (PROZAC) 20 MG capsule TAKE 1 CAPSULE BY MOUTH  DAILY TO BE COMBINED WITH  40 MG 90 capsule 3  . FLUoxetine (PROZAC) 40 MG capsule TAKE 1 CAPSULE BY MOUTH  DAILY TO BE COMBINED WITH  20 MG (TOTAL 60MG  DAILY) 90 capsule 3  . fluticasone (FLONASE) 50 MCG/ACT nasal spray   1  . GLUCOSAMINE-CHONDROITIN PO Take by mouth.    Marland Kitchen guaiFENesin (MUCINEX) 600 MG 12 hr tablet Take by mouth.    . hydrOXYzine (VISTARIL) 25  MG capsule TAKE 1 CAP BY MOUTH DAILY AS NEEDED TO BE TAKE 1-3 TIMES A WEEK AS NEEDED FOR SEVERE PANIC ATTACKS 45 capsule 1  . lamoTRIgine (LAMICTAL) 150 MG tablet TAKE 1 TABLET BY MOUTH  TWICE DAILY 180 tablet 3  . levothyroxine (SYNTHROID) 175 MCG tablet Take 150 mcg by mouth.     . Levothyroxine Sodium (SYNTHROID PO) Take by mouth.    . loratadine (CLARITIN REDITABS) 10 MG dissolvable tablet Take by mouth.    Marland Kitchen MILK THISTLE PO Take by mouth.    . Multiple Vitamin (MULTI-VITAMIN) tablet Take 1 tablet by mouth daily.    Marland Kitchen omeprazole (PRILOSEC) 40 MG capsule TAKE 1 CAPSULE BY MOUTH EVERY DAY 90 capsule 0  . QUEtiapine (SEROQUEL XR) 50 MG TB24 24 hr tablet Take 2 tablets (100 mg total) by mouth at bedtime. 180 tablet 1  . SM CALCIUM 600/VITAMIN D 600-400 MG-UNIT tablet Take 1 tablet by mouth 2 (two) times daily.    Marland Kitchen SYNTHROID 150 MCG tablet Take 150 mcg by mouth daily.    Marland Kitchen thyroid (ARMOUR) 120 MG tablet Take by mouth.    . traZODone (DESYREL) 100 MG tablet TAKE 1 AND 1/2 TO 2 TABLETS BY MOUTH AT BEDTIME AS  NEEDED FOR SLEEP 180 tablet 3  . triamcinolone cream (KENALOG) 0.1 % TRIAMCINOLONE ACETONIDE, 0.1% (External Cream)  1 (one) Cream Cream use as directed for 0 days  Quantity: 30;  Refills: 1   Ordered :12-Jan-2014  Meyer Cory ;  Started 12-Jan-2014 Active    . VITAMIN D PO Take by mouth.    Marland Kitchen VITAMIN E PO Take by mouth.     Current Facility-Administered Medications   Medication Dose Route Frequency Provider Last Rate Last Admin  . 0.9 %  sodium chloride infusion  500 mL Intravenous Continuous Gatha Mayer, MD         Musculoskeletal: Strength & Muscle Tone: [place]: UTA Patient leans: N/A  Psychiatric Specialty Exam: Review of Systems  Constitutional: Positive for fatigue (Improving).  Musculoskeletal: Positive for arthralgias and back pain.       Knee pain-Left side   Psychiatric/Behavioral: Negative for agitation, behavioral problems, confusion, decreased concentration, dysphoric mood, hallucinations, self-injury, sleep disturbance and suicidal ideas. The patient is not nervous/anxious and is not hyperactive.   All other systems reviewed and are negative.   There were no vitals taken for this visit.There is no height or weight on file to calculate BMI.  General Appearance: UTA  Eye Contact:  UTA  Speech:  Clear and Coherent  Volume:  Normal  Mood:  Euthymic  Affect:  UTA  Thought Process:  Goal Directed and Descriptions of Associations: Intact  Orientation:  Full (Time, Place, and Person)  Thought Content: Logical   Suicidal Thoughts:  No  Homicidal Thoughts:  No  Memory:  Immediate;   Fair Recent;   Fair Remote;   Fair  Judgement:  Fair  Insight:  Fair  Psychomotor Activity:  UTA  Concentration:  Concentration: Fair and Attention Span: Fair  Recall:  [employer] of Knowledge: Fair  Language: Fair  Akathisia:  No  Handed:  Right  AIMS (if indicated): UTA  Assets:  Communication Skills Desire for Improvement Housing Social Support  ADL's:  Intact  Cognition: WNL  Sleep:  Fair   Screenings: PHQ2-9     Video Visit from 06/13/2019 in [place] Office Visit from 12/03/2014 in [facility]  PHQ-2 Total Score  0 2  PHQ-9 Total Score 4 20       Assessment and Plan: Jamie Baxter is a 69 year old Caucasian female who has a history of MDD, panic attacks, GERD,  thyroid cancer in remission, osteoarthritis was evaluated by telemedicine today.  Patient is biologically predisposed given her history of medical problems as well as trauma.  Patient is currently making progress with regards to her mood.  Plan as noted below.  Plan MDD-improving Prozac 60 mg p.o. daily Lamotrigine 150 mg p.o. twice daily Seroquel extended release 100 mg p.o. nightly. Trazodone 150 to 200 mg p.o. nightly as needed for sleep Her sleep problems are also due to her pain, which has improved.  Panic attacks-stable Prozac as prescribed Patient was advised to restart CBT as needed  Insomnia-stable Trazodone 150 to 200 mg p.o. nightly as needed.  Discussed with patient she could try reducing the dosage of trazodone to 100 mg at bedtime as needed since she is no longer in a lot of pain which is affecting her sleep. Hydroxyzine as needed which he takes sporadically.  Follow-up in clinic in 4 to 6 weeks or sooner if needed.  I have spent atleast 20 minutes non face to face  with patient today. More than 50 % of the time was spent for preparing to see the patient ( e.g., review of test, records ), ordering medications and test ,psychoeducation and supportive psychotherapy and care coordination,as well as documenting clinical information in electronic health record. This note was generated in part or whole with voice recognition software. Voice recognition is usually quite accurate but there are transcription errors that can and very often do occur. I apologize for any typographical errors that were not detected and corrected.        Ursula Alert, MD 12/26/2019, 8:22 AM

## 2020-02-06 DIAGNOSIS — M1990 Unspecified osteoarthritis, unspecified site: Secondary | ICD-10-CM | POA: Diagnosis not present

## 2020-02-06 DIAGNOSIS — E890 Postprocedural hypothyroidism: Secondary | ICD-10-CM | POA: Diagnosis not present

## 2020-02-06 DIAGNOSIS — Z1211 Encounter for screening for malignant neoplasm of colon: Secondary | ICD-10-CM | POA: Diagnosis not present

## 2020-02-06 DIAGNOSIS — K219 Gastro-esophageal reflux disease without esophagitis: Secondary | ICD-10-CM | POA: Diagnosis not present

## 2020-02-06 DIAGNOSIS — K5190 Ulcerative colitis, unspecified, without complications: Secondary | ICD-10-CM | POA: Diagnosis not present

## 2020-02-06 DIAGNOSIS — Z853 Personal history of malignant neoplasm of breast: Secondary | ICD-10-CM | POA: Diagnosis not present

## 2020-02-06 DIAGNOSIS — Z139 Encounter for screening, unspecified: Secondary | ICD-10-CM | POA: Diagnosis not present

## 2020-02-06 DIAGNOSIS — Z9181 History of falling: Secondary | ICD-10-CM | POA: Diagnosis not present

## 2020-02-20 ENCOUNTER — Other Ambulatory Visit: Payer: Self-pay | Admitting: Psychiatry

## 2020-02-20 DIAGNOSIS — F330 Major depressive disorder, recurrent, mild: Secondary | ICD-10-CM

## 2020-02-22 ENCOUNTER — Other Ambulatory Visit: Payer: Self-pay

## 2020-02-22 ENCOUNTER — Telehealth (INDEPENDENT_AMBULATORY_CARE_PROVIDER_SITE_OTHER): Payer: Medicare Other | Admitting: Psychiatry

## 2020-02-22 ENCOUNTER — Encounter: Payer: Self-pay | Admitting: Psychiatry

## 2020-02-22 DIAGNOSIS — F410 Panic disorder [episodic paroxysmal anxiety] without agoraphobia: Secondary | ICD-10-CM | POA: Diagnosis not present

## 2020-02-22 DIAGNOSIS — F330 Major depressive disorder, recurrent, mild: Secondary | ICD-10-CM | POA: Diagnosis not present

## 2020-02-22 DIAGNOSIS — Z79899 Other long term (current) drug therapy: Secondary | ICD-10-CM

## 2020-02-22 DIAGNOSIS — G4700 Insomnia, unspecified: Secondary | ICD-10-CM | POA: Diagnosis not present

## 2020-02-22 MED ORDER — QUETIAPINE FUMARATE ER 50 MG PO TB24: 100.0000 mg | ORAL_TABLET | Freq: Every evening | ORAL | 0 refills | Status: DC | PRN

## 2020-02-22 NOTE — Progress Notes (Signed)
Virtual Visit via Video Note  I connected with Jamie Baxter on 02/22/20 at  8:30 AM EST by a video enabled telemedicine application and verified that I am speaking with the correct person using two identifiers.  Location Provider Location : [facility] Patient Location : Home  Participants: Patient , Provider    I discussed the limitations of evaluation and management by telemedicine and the availability of in person appointments. The patient expressed understanding and agreed to proceed.     I discussed the assessment and treatment plan with the patient. The patient was provided an opportunity to ask questions and all were answered. The patient agreed with the plan and demonstrated an understanding of the instructions.   The patient was advised to call back or seek an in-person evaluation if the symptoms worsen or if the condition fails to improve as anticipated.   [facility] MD OP Progress Note  02/22/2020 2:39 PM Jamie Baxter  MRN:  [id number]  Chief Complaint:  Chief Complaint    Follow-up     HPI: Jamie Baxter is a 70 year old Caucasian female, married, retired, lives in [place], has a history of MDD, panic disorder, osteoarthritis, history of thyroid cancer, GERD was evaluated by telemedicine today.  Patient today reports her depression is getting better.  She denies any significant panic attacks.  She however reports she continues to struggle with sleep.  She reports she was able to keep a log of her sleep the past few weeks.  She reports she had restless sleep several times in the past few weeks.  The few times after waking up in the middle of the night she could not fall back asleep.  She denies having pain at night which would be keeping her up.  She reports her pain medication was recently changed to Celebrex which helps.  She continues to take Seroquel as well as trazodone at night.  Patient reports she had a sleep study previously and was told to have mild apneic episodes.   Patient completed epworth sleep scale today.  She does report snoring at night.  She however denies any apneic episodes or taking naps or dozing off during the day.  Patient denies any suicidality, homicidality or perceptual disturbances.  Patient denies any other concerns today.  Visit Diagnosis:    ICD-10-CM   1. MDD (major depressive disorder), recurrent episode, mild ([facility])  F33.0 QUEtiapine (SEROQUEL XR) 50 MG TB24 24 hr tablet  2. Panic disorder  F41.0   3. Insomnia, unspecified type  G47.00   4. High risk medication use  Z79.899 TSH    Hemoglobin A1C    Lipid panel    Prolactin    Past Psychiatric History: I have reviewed past psychiatric history from her progress note on 08/11/2018.  Past trials of Pristiq, Prozac, Lamictal, Seroquel  Past Medical History:  Past Medical History:  Diagnosis Date  . Anxiety   . Depression   . GERD (gastroesophageal reflux disease)   . Thyroid disease     Past Surgical History:  Procedure Laterality Date  . ABSCESS DRAINAGE    . BREAST BIOPSY    . BREAST SURGERY     breast biopsy  . COLONOSCOPY  2006, 07/17/10   Normal (except melanosis), ? hx polyps in 1990's - [place]  . rotator cuff    . THYROID LOBECTOMY  1978   goiter  . THYROIDECTOMY SUBSTERNAL  2010   left side/cancer/radioactive iodine 2 years in a row for Ca cells  .  VAGINAL HYSTERECTOMY     fiboid tumors    Family Psychiatric History: I have reviewed family psychiatric history from my progress note on 08/11/2018  Family History:  Family History  Problem Relation Age of Onset  . GER disease Father   . Rheum arthritis Sister   . Healthy Sister   . Hypertension Mother   . Mental illness Neg Hx     Social History: Reviewed social history from my progress note on 08/11/2018 Social History   Socioeconomic History  . Marital status: Married    Spouse name: monroe  . Number of children: 1  . Years of education: Not on file  . Highest education level: High school graduate   Occupational History  . Not on file  Tobacco Use  . Smoking status: Former Smoker    Types: Cigarettes    Quit date: 02/03/1976    Years since quitting: 44.0  . Smokeless tobacco: Never Used  Vaping Use  . Vaping Use: Never used  Substance and Sexual Activity  . Alcohol use: No  . Drug use: No  . Sexual activity: Not Currently  Other Topics Concern  . Not on file  Social History Narrative  . Not on file   Social Determinants of Health   Financial Resource Strain: Not on file  Food Insecurity: Not on file  Transportation Needs: Not on file  Physical Activity: Not on file  Stress: Not on file  Social Connections: Not on file    Allergies:  Allergies  Allergen Reactions  . Amantadine Other (See Comments)    disorientation  . Benztropine   . Geodon  [Ziprasidone Hcl] Other (See Comments)    disorientation  . Sulfa Antibiotics   . Z-Pak [Azithromycin]     hallucinations    Metabolic Disorder Labs: No results found for: HGBA1C, MPG No results found for: PROLACTIN No results found for: CHOL, TRIG, HDL, CHOLHDL, VLDL, LDLCALC No results found for: TSH  Therapeutic Level Labs: No results found for: LITHIUM No results found for: VALPROATE No components found for:  CBMZ  Current Medications: Current Outpatient Medications  Medication Sig Dispense Refill  . celecoxib (CELEBREX) 100 MG capsule Take 100 mg by mouth 2 (two) times daily.    Marland Kitchen acetaminophen (TYLENOL) 325 MG tablet Take by mouth.    . B Complex Vitamins (B COMPLEX PO) Take by mouth.    . Calcium Carbonate-Vitamin D 600-400 MG-UNIT tablet Take by mouth.    . Coenzyme Q10 (COQ10 PO) Take by mouth.    . etodolac (LODINE) 400 MG tablet Take by mouth. (Patient not taking: Reported on 02/22/2020)    . FLUoxetine (PROZAC) 20 MG capsule TAKE 1 CAPSULE BY MOUTH  DAILY TO BE COMBINED WITH  40 MG 90 capsule 3  . FLUoxetine (PROZAC) 40 MG capsule TAKE 1 CAPSULE BY MOUTH  DAILY TO BE COMBINED WITH  20 MG (TOTAL 60MG   DAILY) 90 capsule 3  . fluticasone (FLONASE) 50 MCG/ACT nasal spray   1  . GLUCOSAMINE-CHONDROITIN PO Take by mouth.    Marland Kitchen guaiFENesin (MUCINEX) 600 MG 12 hr tablet Take by mouth.    . hydrOXYzine (VISTARIL) 25 MG capsule TAKE 1 CAP BY MOUTH DAILY AS NEEDED TO BE TAKE 1-3 TIMES A WEEK AS NEEDED FOR SEVERE PANIC ATTACKS 45 capsule 1  . lamoTRIgine (LAMICTAL) 150 MG tablet TAKE 1 TABLET BY MOUTH  TWICE DAILY 180 tablet 3  . levothyroxine (SYNTHROID) 175 MCG tablet Take 150 mcg by mouth.     Marland Kitchen  Levothyroxine Sodium (SYNTHROID PO) Take by mouth.    . loratadine (CLARITIN REDITABS) 10 MG dissolvable tablet Take by mouth.    Marland Kitchen MILK THISTLE PO Take by mouth.    . Multiple Vitamin (MULTI-VITAMIN) tablet Take 1 tablet by mouth daily.    Marland Kitchen omeprazole (PRILOSEC) 40 MG capsule TAKE 1 CAPSULE BY MOUTH EVERY DAY 90 capsule 0  . QUEtiapine (SEROQUEL XR) 50 MG TB24 24 hr tablet Take 2-3 tablets (100-150 mg total) by mouth at bedtime as needed. 270 tablet 0  . SM CALCIUM 600/VITAMIN D 600-400 MG-UNIT tablet Take 1 tablet by mouth 2 (two) times daily.    Marland Kitchen SYNTHROID 150 MCG tablet Take 150 mcg by mouth daily.    Marland Kitchen thyroid (ARMOUR) 120 MG tablet Take by mouth.    . traZODone (DESYREL) 100 MG tablet TAKE 1 AND 1/2 TO 2 TABLETS BY MOUTH AT BEDTIME AS  NEEDED FOR SLEEP 180 tablet 3  . triamcinolone cream (KENALOG) 0.1 % TRIAMCINOLONE ACETONIDE, 0.1% (External Cream)  1 (one) Cream Cream use as directed for 0 days  Quantity: 30;  Refills: 1   Ordered :12-Jan-2014  Meyer Cory ;  Started 12-Jan-2014 Active    . VITAMIN D PO Take by mouth.    Marland Kitchen VITAMIN E PO Take by mouth.     Current Facility-Administered Medications  Medication Dose Route Frequency Provider Last Rate Last Admin  . 0.9 %  sodium chloride infusion  500 mL Intravenous Continuous Gatha Mayer, MD         Musculoskeletal: Strength & Muscle Tone: [place]: UTA Patient leans: N/A  Psychiatric Specialty Exam: Review of  Systems  Psychiatric/Behavioral: Positive for dysphoric mood and sleep disturbance.  All other systems reviewed and are negative.   There were no vitals taken for this visit.There is no height or weight on file to calculate BMI.  General Appearance: Casual  Eye Contact:  Fair  Speech:  Clear and Coherent  Volume:  Normal  Mood:  Depressed improving  Affect:  Congruent  Thought Process:  Goal Directed and Descriptions of Associations: Intact  Orientation:  Full (Time, Place, and Person)  Thought Content: Logical   Suicidal Thoughts:  No  Homicidal Thoughts:  No  Memory:  Immediate;   Fair Recent;   Fair Remote;   Fair  Judgement:  Fair  Insight:  Fair  Psychomotor Activity:  Normal  Concentration:  Concentration: Fair and Attention Span: Fair  Recall:  [employer] of Knowledge: Fair  Language: Fair  Akathisia:  No  Handed:  Right  AIMS (if indicated): UTA  Assets:  Communication Skills Desire for Improvement Housing Intimacy Social Support  ADL's:  Intact  Cognition: WNL  Sleep:  Restless   Screenings: PHQ2-9   Flowsheet Row Video Visit from 06/13/2019 in [place] Office Visit from 12/03/2014 in [facility]  PHQ-2 Total Score 0 2  PHQ-9 Total Score 4 20       Assessment and Plan: Jamie Baxter is a 70 year old Caucasian female who has a history of MDD, panic attacks, GERD, thyroid cancer in remission, osteoarthritis was evaluated by telemedicine today.  Patient is biologically predisposed given her history of medical problems as well as trauma.  Patient is currently struggling with sleep problems.  Plan as noted below.  Plan MDD in partial remission Prozac 60 mg p.o. daily Lamotrigine 150 mg p.o. twice daily We will increase Seroquel extended release to 150 mg p.o. nightly Trazodone  150 to 200 mg manage nightly as needed for sleep   Panic attacks- stable Prozac as prescribed  Insomnia-unstable Epworth sleep  scale completed today equals 3 Increase Seroquel extended release to 100 to 150 mg p.o. nightly as needed Continue trazodone 150 to 200 mg p.o. nightly as needed  High risk medication use-patient will benefit from the following labs-hemoglobin A1c, lipid panel, prolactin level, TSH.  Patient will get it from her primary care office.  She will fax the results to [place].   Follow-up in clinic in 4 weeks or sooner if needed.  I have spent atleast 20 minutes face to face by video with patient today. More than 50 % of the time was spent for preparing to see the patient ( e.g., review of test, records ),  ordering medications and test ,psychoeducation and supportive psychotherapy and care coordination,as well as documenting clinical information in electronic health record. This note was generated in part or whole with voice recognition software. Voice recognition is usually quite accurate but there are transcription errors that can and very often do occur. I apologize for any typographical errors that were not detected and corrected.        Ursula Alert, MD 02/22/2020, 2:39 PM

## 2020-03-05 DIAGNOSIS — C73 Malignant neoplasm of thyroid gland: Secondary | ICD-10-CM | POA: Diagnosis not present

## 2020-03-05 DIAGNOSIS — N289 Disorder of kidney and ureter, unspecified: Secondary | ICD-10-CM | POA: Diagnosis not present

## 2020-03-05 DIAGNOSIS — E890 Postprocedural hypothyroidism: Secondary | ICD-10-CM | POA: Diagnosis not present

## 2020-03-07 ENCOUNTER — Other Ambulatory Visit: Payer: Self-pay | Admitting: Psychiatry

## 2020-03-07 DIAGNOSIS — F330 Major depressive disorder, recurrent, mild: Secondary | ICD-10-CM

## 2020-03-18 ENCOUNTER — Other Ambulatory Visit: Payer: Self-pay

## 2020-03-18 ENCOUNTER — Telehealth: Payer: Self-pay

## 2020-03-18 ENCOUNTER — Telehealth (INDEPENDENT_AMBULATORY_CARE_PROVIDER_SITE_OTHER): Payer: Medicare Other | Admitting: Psychiatry

## 2020-03-18 ENCOUNTER — Encounter: Payer: Self-pay | Admitting: Psychiatry

## 2020-03-18 DIAGNOSIS — F410 Panic disorder [episodic paroxysmal anxiety] without agoraphobia: Secondary | ICD-10-CM

## 2020-03-18 DIAGNOSIS — F3342 Major depressive disorder, recurrent, in full remission: Secondary | ICD-10-CM

## 2020-03-18 DIAGNOSIS — G4701 Insomnia due to medical condition: Secondary | ICD-10-CM | POA: Diagnosis not present

## 2020-03-18 DIAGNOSIS — K219 Gastro-esophageal reflux disease without esophagitis: Secondary | ICD-10-CM | POA: Diagnosis not present

## 2020-03-18 DIAGNOSIS — E890 Postprocedural hypothyroidism: Secondary | ICD-10-CM | POA: Diagnosis not present

## 2020-03-18 DIAGNOSIS — C73 Malignant neoplasm of thyroid gland: Secondary | ICD-10-CM | POA: Diagnosis not present

## 2020-03-18 DIAGNOSIS — N289 Disorder of kidney and ureter, unspecified: Secondary | ICD-10-CM | POA: Diagnosis not present

## 2020-03-18 NOTE — Progress Notes (Signed)
Virtual Visit via Telephone Note  I connected with Jamie Baxter on 03/18/20 at  8:30 AM EST by telephone and verified that I am speaking with the correct person using two identifiers.  Location Provider Location : [facility] Patient Location : Car  Participants: Patient , Provider   I discussed the limitations, risks, security and privacy concerns of performing an evaluation and management service by telephone and the availability of in person appointments. I also discussed with the patient that there may be a patient responsible charge related to this service. The patient expressed understanding and agreed to proceed.  I discussed the assessment and treatment plan with the patient. The patient was provided an opportunity to ask questions and all were answered. The patient agreed with the plan and demonstrated an understanding of the instructions.   The patient was advised to call back or seek an in-person evaluation if the symptoms worsen or if the condition fails to improve as anticipated.   [facility] MD OP Progress Note  03/18/2020 5:00 PM Jamie Baxter  MRN:  [id number]  Chief Complaint:  Chief Complaint    Follow-up     HPI: Jamie Baxter is a 70 year old Caucasian female, married, retired, lives in [place], has a history of MDD, panic disorder, osteoarthritis, history of thyroid cancer, GERD was evaluated by telemedicine today.  Patient today reports she is unable to collect by video and hence evaluation was done by audio only.  Patient today reports she is currently sleeping better since she has been on the higher doses of Seroquel.  She is tolerating it well.  Denies side effects.  She reports her depressive symptoms as improving.  She reports she is able to enjoy her life and enjoying her TV shows, spending time with her husband and so on.  Patient denies any suicidality, homicidality or perceptual disturbances.  She is compliant on all her medications.  Denies side  effects.  Patient denies any other concerns today.  Visit Diagnosis:    ICD-10-CM   1. MDD (major depressive disorder), recurrent, in full remission ([place])  F33.42   2. Panic disorder  F41.0   3. Insomnia due to medical condition  G47.01     Past Psychiatric History: I have reviewed past psychiatric history from my progress note on 08/11/2018.  Past also Pristiq, Prozac, Lamictal, Seroquel  Past Medical History:  Past Medical History:  Diagnosis Date  . Anxiety   . Depression   . GERD (gastroesophageal reflux disease)   . Thyroid disease     Past Surgical History:  Procedure Laterality Date  . ABSCESS DRAINAGE    . BREAST BIOPSY    . BREAST SURGERY     breast biopsy  . COLONOSCOPY  2006, 07/17/10   Normal (except melanosis), ? hx polyps in 1990's - [place]  . rotator cuff    . THYROID LOBECTOMY  1978   goiter  . THYROIDECTOMY SUBSTERNAL  2010   left side/cancer/radioactive iodine 2 years in a row for Ca cells  . VAGINAL HYSTERECTOMY     fiboid tumors    Family Psychiatric History: I have reviewed family psychiatric history from my progress note on 08/11/2018  Family History:  Family History  Problem Relation Age of Onset  . GER disease Father   . Rheum arthritis Sister   . Healthy Sister   . Hypertension Mother   . Mental illness Neg Hx     Social History: Reviewed social progress note on 08/11/2018 Social History  Socioeconomic History  . Marital status: Married    Spouse name: monroe  . Number of children: 1  . Years of education: Not on file  . Highest education level: High school graduate  Occupational History  . Not on file  Tobacco Use  . Smoking status: Former Smoker    Types: Cigarettes    Quit date: 02/03/1976    Years since quitting: 44.1  . Smokeless tobacco: Never Used  Vaping Use  . Vaping Use: Never used  Substance and Sexual Activity  . Alcohol use: No  . Drug use: No  . Sexual activity: Not Currently  Other Topics Concern  . Not on file   Social History Narrative  . Not on file   Social Determinants of Health   Financial Resource Strain: Not on file  Food Insecurity: Not on file  Transportation Needs: Not on file  Physical Activity: Not on file  Stress: Not on file  Social Connections: Not on file    Allergies:  Allergies  Allergen Reactions  . Amantadine Other (See Comments)    disorientation  . Benztropine   . Geodon  [Ziprasidone Hcl] Other (See Comments)    disorientation  . Sulfa Antibiotics   . Z-Pak [Azithromycin]     hallucinations    Metabolic Disorder Labs: No results found for: HGBA1C, MPG No results found for: PROLACTIN No results found for: CHOL, TRIG, HDL, CHOLHDL, VLDL, LDLCALC No results found for: TSH  Therapeutic Level Labs: No results found for: LITHIUM No results found for: VALPROATE No components found for:  CBMZ  Current Medications: Current Outpatient Medications  Medication Sig Dispense Refill  . acetaminophen (TYLENOL) 325 MG tablet Take by mouth.    . B Complex Vitamins (B COMPLEX PO) Take by mouth.    . Calcium Carbonate-Vitamin D 600-400 MG-UNIT tablet Take by mouth.    . celecoxib (CELEBREX) 100 MG capsule Take 100 mg by mouth 2 (two) times daily.    . Coenzyme Q10 (COQ10 PO) Take by mouth.    . etodolac (LODINE) 400 MG tablet Take by mouth. (Patient not taking: Reported on 02/22/2020)    . FLUoxetine (PROZAC) 20 MG capsule TAKE 1 CAPSULE BY MOUTH  DAILY TO BE COMBINED WITH  40 MG 90 capsule 3  . FLUoxetine (PROZAC) 40 MG capsule TAKE 1 CAPSULE BY MOUTH  DAILY TO BE COMBINED WITH  20 MG (TOTAL 60MG  DAILY) 90 capsule 3  . fluticasone (FLONASE) 50 MCG/ACT nasal spray   1  . GLUCOSAMINE-CHONDROITIN PO Take by mouth.    Marland Kitchen guaiFENesin (MUCINEX) 600 MG 12 hr tablet Take by mouth.    . hydrOXYzine (VISTARIL) 25 MG capsule TAKE 1 CAP BY MOUTH DAILY AS NEEDED TO BE TAKE 1-3 TIMES A WEEK AS NEEDED FOR SEVERE PANIC ATTACKS 45 capsule 1  . lamoTRIgine (LAMICTAL) 150 MG tablet TAKE  1 TABLET BY MOUTH  TWICE DAILY 180 tablet 3  . levothyroxine (SYNTHROID) 175 MCG tablet Take 150 mcg by mouth.     . loratadine (CLARITIN REDITABS) 10 MG dissolvable tablet Take by mouth.    Marland Kitchen MILK THISTLE PO Take by mouth.    . Multiple Vitamin (MULTI-VITAMIN) tablet Take 1 tablet by mouth daily.    Marland Kitchen omeprazole (PRILOSEC) 40 MG capsule TAKE 1 CAPSULE BY MOUTH EVERY DAY 90 capsule 0  . QUEtiapine (SEROQUEL XR) 50 MG TB24 24 hr tablet TAKE 2 TO 3 TABLETS BY  MOUTH AT BEDTIME AS NEEDED 90 tablet 2  . SM  CALCIUM 600/VITAMIN D 600-400 MG-UNIT tablet Take 1 tablet by mouth 2 (two) times daily.    Marland Kitchen SYNTHROID 150 MCG tablet Take 150 mcg by mouth daily.    Marland Kitchen thyroid (ARMOUR) 120 MG tablet Take by mouth.    . traZODone (DESYREL) 100 MG tablet TAKE 1 AND 1/2 TO 2 TABLETS BY MOUTH AT BEDTIME AS  NEEDED FOR SLEEP 180 tablet 3  . triamcinolone cream (KENALOG) 0.1 % TRIAMCINOLONE ACETONIDE, 0.1% (External Cream)  1 (one) Cream Cream use as directed for 0 days  Quantity: 30;  Refills: 1   Ordered :12-Jan-2014  Meyer Cory ;  Started 12-Jan-2014 Active    . VITAMIN D PO Take by mouth.    Marland Kitchen VITAMIN E PO Take by mouth.     Current Facility-Administered Medications  Medication Dose Route Frequency Provider Last Rate Last Admin  . 0.9 %  sodium chloride infusion  500 mL Intravenous Continuous Gatha Mayer, MD         Musculoskeletal: Strength & Muscle Tone: [place]: UTA Patient leans: N/A  Psychiatric Specialty Exam: Review of Systems  Psychiatric/Behavioral: Negative for agitation, behavioral problems, confusion, decreased concentration, dysphoric mood, hallucinations, self-injury, sleep disturbance and suicidal ideas. The patient is not nervous/anxious and is not hyperactive.   All other systems reviewed and are negative.   There were no vitals taken for this visit.There is no height or weight on file to calculate BMI.  General Appearance: UTA  Eye Contact:  UTA  Speech:   Clear and Coherent  Volume:  Normal  Mood:  Euthymic  Affect:  UTA  Thought Process:  Goal Directed and Descriptions of Associations: Intact  Orientation:  Full (Time, Place, and Person)  Thought Content: Logical   Suicidal Thoughts:  No  Homicidal Thoughts:  No  Memory:  Immediate;   Fair Recent;   Fair Remote;   Fair  Judgement:  Fair  Insight:  Fair  Psychomotor Activity:  UTA  Concentration:  Concentration: Fair and Attention Span: Fair  Recall:  [employer] of Knowledge: Fair  Language: Fair  Akathisia:  No  Handed:  Right  AIMS (if indicated): UTA  Assets:  Communication Skills Desire for Improvement Housing Intimacy Social Support Talents/Skills  ADL's:  Intact  Cognition: WNL  Sleep:  Fair   Screenings: PHQ2-9   Flowsheet Row Video Visit from 03/18/2020 in [facility] Video Visit from 06/13/2019 in [facility] Office Visit from 12/03/2014 in [facility]  PHQ-2 Total Score 0 0 2  PHQ-9 Total Score -- 4 20    Flowsheet Row Video Visit from 03/18/2020 in [facility] No Risk       Assessment and Plan: Jamie Baxter is a 70 year old Caucasian female who has a history of MDD, panic attacks, GERD, thyroid cancer in remission, osteoarthritis was evaluated by telemedicine today.  She is biologically predisposed given her history of medical problems as well as trauma.  Patient is currently making progress with regards to her depression and sleep.  Plan as noted below.  Plan MDD in full remission Prozac 60 mg p.o. daily Lamotrigine 150 mg p.o. twice daily Seroquel extended release 150 mg p.o. nightly Trazodone 150-200 mg p.o. nightly as needed  Panic attacks-stable Prozac as prescribed  Insomnia-improving Continue Seroquel as prescribed Trazodone 150-200 mg p.o. nightly as needed  High risk medication use-pending labs-hemoglobin A1c,  lipid panel, prolactin level, TSH-patient to get it  from primary care office and fax the results to [place].  Follow-up in clinic in 6 to 8 weeks or sooner if needed.  I have spent atleast 20 minutes non face to face  with patient today. More than 50 % of the time was spent for preparing to see the patient ( e.g., review of test, records ), obtaining and to review and separately obtained history , ordering medications and test ,psychoeducation and supportive psychotherapy and care coordination,as well as documenting clinical information in electronic health record. This note was generated in part or whole with voice recognition software. Voice recognition is usually quite accurate but there are transcription errors that can and very often do occur. I apologize for any typographical errors that were not detected and corrected.       Ursula Alert, MD 03/18/2020, 5:00 PM

## 2020-03-18 NOTE — Telephone Encounter (Signed)
prior auth was submitted for the quetiapine fumarate er 50mg  tablet. - [place] was [place] .  [id number] approved through 02-01-21

## 2020-04-05 ENCOUNTER — Telehealth: Payer: Self-pay | Admitting: Psychiatry

## 2020-04-05 NOTE — Telephone Encounter (Signed)
Received lab results from [place] 08/23/2019-faxed over by [facility].  CMP-creatinine elevated at 1.22, GFR low at 46  Total cholesterol-elevated at 215, LDL elevated at 129,  TSH low at 0.196 T4-high at 13.4  CBC with differential-within normal limits    CMP-dated 03/05/2020 Creatinine high at 1.17 GFR low at 48 Otherwise within normal limits AST ALT-within normal limits  TSH-2.470 T4-9 0.3 All within normal limits   Will fax lab results to medical records.  Patient will continue to follow-up with her providers for management of her medical problems.

## 2020-05-06 ENCOUNTER — Telehealth (INDEPENDENT_AMBULATORY_CARE_PROVIDER_SITE_OTHER): Payer: Medicare Other | Admitting: Psychiatry

## 2020-05-06 ENCOUNTER — Encounter: Payer: Self-pay | Admitting: Psychiatry

## 2020-05-06 ENCOUNTER — Other Ambulatory Visit: Payer: Self-pay

## 2020-05-06 DIAGNOSIS — F410 Panic disorder [episodic paroxysmal anxiety] without agoraphobia: Secondary | ICD-10-CM | POA: Diagnosis not present

## 2020-05-06 DIAGNOSIS — G4701 Insomnia due to medical condition: Secondary | ICD-10-CM

## 2020-05-06 DIAGNOSIS — F3342 Major depressive disorder, recurrent, in full remission: Secondary | ICD-10-CM | POA: Diagnosis not present

## 2020-05-06 MED ORDER — QUETIAPINE FUMARATE ER 150 MG PO TB24: 150.0000 mg | ORAL_TABLET | Freq: Every day | ORAL | 0 refills | Status: DC

## 2020-05-06 NOTE — Progress Notes (Signed)
Virtual Visit via Video Note  I connected with Jamie Baxter on 05/06/20 at 10:00 AM EDT by a video enabled telemedicine application and verified that I am speaking with the correct person using two identifiers.  Location Provider Location : [facility] Patient Location : Home  Participants: Patient , Provider   I discussed the limitations of evaluation and management by telemedicine and the availability of in person appointments. The patient expressed understanding and agreed to proceed.    I discussed the assessment and treatment plan with the patient. The patient was provided an opportunity to ask questions and all were answered. The patient agreed with the plan and demonstrated an understanding of the instructions.   The patient was advised to call back or seek an in-person evaluation if the symptoms worsen or if the condition fails to improve as anticipated.  Video connection was lost at less than 50% of the duration of the visit, at which time the remainder of the visit was completed through audio only    [facility] MD OP Progress Note  05/06/2020 3:59 PM Jamie Baxter  MRN:  [id number]  Chief Complaint:  Chief Complaint    Follow-up; Anxiety; Depression     HPI: Jamie Baxter is a 70 year old Caucasian female, married, retired, lives in [place], has a history of MDD, panic disorder, osteoarthritis, history of thyroid cancer, GERD was evaluated by telemedicine today.  Patient today reports she is currently doing well.  Denies any significant mood lability.  Denies any suicidal thoughts.  Denies any perceptual disturbances.  Reports anxiety symptoms are under control.  Patient is compliant on medications.  Denies side effects.  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  Patient hence wants [facility] to see if it will be more affordable there.  Reviewed and discussed her labs with her today.  She has upcoming appointment  with primary care provider.  Patient denies any other concerns today.  Visit Diagnosis:    ICD-10-CM   1. MDD (major depressive disorder), recurrent, in full remission (Jamie Baxter)  F33.42 QUEtiapine Fumarate (SEROQUEL XR) 150 MG 24 hr tablet  2. Panic disorder  F41.0   3. Insomnia due to medical condition  G47.01     Past Psychiatric History: I have reviewed past psychiatric history from my progress note on 08/11/2018.  Past trials of Pristiq, Prozac, Lamictal, Seroquel  Past Medical History:  Past Medical History:  Diagnosis Date  . Anxiety   . Depression   . GERD (gastroesophageal reflux disease)   . Thyroid disease     Past Surgical History:  Procedure Laterality Date  . ABSCESS DRAINAGE    . BREAST BIOPSY    . BREAST SURGERY     breast biopsy  . COLONOSCOPY  2006, 07/17/10   Normal (except melanosis), ? hx polyps in 1990's - [place]  . rotator cuff    . THYROID LOBECTOMY  1978   goiter  . THYROIDECTOMY SUBSTERNAL  2010   left side/cancer/radioactive iodine 2 years in a row for Ca cells  . VAGINAL HYSTERECTOMY     fiboid tumors    Family Psychiatric History: I have reviewed family psychiatric history from my progress note on 08/11/2018  Family History:  Family History  Problem Relation Age of Onset  . GER disease Father   . Rheum arthritis Sister   . Healthy Sister   . Hypertension Mother   . Mental illness Neg Hx  Social History: Reviewed social history from my progress note on 08/11/2018 Social History   Socioeconomic History  . Marital status: Married    Spouse name: monroe  . Number of children: 1  . Years of education: Not on file  . Highest education level: High school graduate  Occupational History  . Not on file  Tobacco Use  . Smoking status: Former Smoker    Types: Cigarettes    Quit date: 02/03/1976    Years since quitting: 44.2  . Smokeless tobacco: Never Used  Vaping Use  . Vaping Use: Never used  Substance and Sexual Activity  . Alcohol use:  No  . Drug use: No  . Sexual activity: Not Currently  Other Topics Concern  . Not on file  Social History Narrative  . Not on file   Social Determinants of Health   Financial Resource Strain: Not on file  Food Insecurity: Not on file  Transportation Needs: Not on file  Physical Activity: Not on file  Stress: Not on file  Social Connections: Not on file    Allergies:  Allergies  Allergen Reactions  . Amantadine Other (See Comments)    disorientation  . Benztropine   . Geodon  [Ziprasidone Hcl] Other (See Comments)    disorientation  . Sulfa Antibiotics   . Z-Pak [Azithromycin]     hallucinations    Metabolic Disorder Labs: No results found for: HGBA1C, MPG No results found for: PROLACTIN No results found for: CHOL, TRIG, HDL, CHOLHDL, VLDL, LDLCALC No results found for: TSH  Therapeutic Level Labs: No results found for: LITHIUM No results found for: VALPROATE No components found for:  CBMZ  Current Medications: Current Outpatient Medications  Medication Sig Dispense Refill  . QUEtiapine Fumarate (SEROQUEL XR) 150 MG 24 hr tablet Take 1 tablet (150 mg total) by mouth at bedtime. 30 tablet 0  . acetaminophen (TYLENOL) 325 MG tablet Take by mouth.    . B Complex Vitamins (B COMPLEX PO) Take by mouth.    . Calcium Carbonate-Vitamin D 600-400 MG-UNIT tablet Take by mouth.    . celecoxib (CELEBREX) 100 MG capsule Take 100 mg by mouth 2 (two) times daily.    . Coenzyme Q10 (COQ10 PO) Take by mouth.    . etodolac (LODINE) 400 MG tablet Take by mouth. (Patient not taking: Reported on 02/22/2020)    . FLUoxetine (PROZAC) 20 MG capsule TAKE 1 CAPSULE BY MOUTH  DAILY TO BE COMBINED WITH  40 MG 90 capsule 3  . FLUoxetine (PROZAC) 40 MG capsule TAKE 1 CAPSULE BY MOUTH  DAILY TO BE COMBINED WITH  20 MG (TOTAL 60MG  DAILY) 90 capsule 3  . fluticasone (FLONASE) 50 MCG/ACT nasal spray   1  . GLUCOSAMINE-CHONDROITIN PO Take by mouth.    Marland Kitchen guaiFENesin (MUCINEX) 600 MG 12 hr tablet  Take by mouth.    . hydrOXYzine (VISTARIL) 25 MG capsule TAKE 1 CAP BY MOUTH DAILY AS NEEDED TO BE TAKE 1-3 TIMES A WEEK AS NEEDED FOR SEVERE PANIC ATTACKS 45 capsule 1  . lamoTRIgine (LAMICTAL) 150 MG tablet TAKE 1 TABLET BY MOUTH  TWICE DAILY 180 tablet 3  . levothyroxine (SYNTHROID) 175 MCG tablet Take 150 mcg by mouth.     . loratadine (CLARITIN REDITABS) 10 MG dissolvable tablet Take by mouth.    Marland Kitchen MILK THISTLE PO Take by mouth.    . Multiple Vitamin (MULTI-VITAMIN) tablet Take 1 tablet by mouth daily.    Marland Kitchen omeprazole (PRILOSEC) 40 MG capsule  TAKE 1 CAPSULE BY MOUTH EVERY DAY 90 capsule 0  . QUEtiapine (SEROQUEL XR) 50 MG TB24 24 hr tablet TAKE 2 TO 3 TABLETS BY  MOUTH AT BEDTIME AS NEEDED 90 tablet 2  . SM CALCIUM 600/VITAMIN D 600-400 MG-UNIT tablet Take 1 tablet by mouth 2 (two) times daily.    Marland Kitchen SYNTHROID 150 MCG tablet Take 150 mcg by mouth daily.    Marland Kitchen thyroid (ARMOUR) 120 MG tablet Take by mouth.    . traZODone (DESYREL) 100 MG tablet TAKE 1 AND 1/2 TO 2 TABLETS BY MOUTH AT BEDTIME AS  NEEDED FOR SLEEP 180 tablet 3  . triamcinolone cream (KENALOG) 0.1 % TRIAMCINOLONE ACETONIDE, 0.1% (External Cream)  1 (one) Cream Cream use as directed for 0 days  Quantity: 30;  Refills: 1   Ordered :12-Jan-2014  Meyer Cory ;  Started 12-Jan-2014 Active    . VITAMIN D PO Take by mouth.    Marland Kitchen VITAMIN E PO Take by mouth.     Current Facility-Administered Medications  Medication Dose Route Frequency Provider Last Rate Last Admin  . 0.9 %  sodium chloride infusion  500 mL Intravenous Continuous Gatha Mayer, MD         Musculoskeletal: Strength & Muscle Tone: [place]: UTA Patient leans: N/A  Psychiatric Specialty Exam: Review of Systems  Psychiatric/Behavioral: Negative for agitation, behavioral problems, confusion, decreased concentration, dysphoric mood, hallucinations, self-injury, sleep disturbance and suicidal ideas. The patient is not nervous/anxious and is not  hyperactive.   All other systems reviewed and are negative.   There were no vitals taken for this visit.There is no height or weight on file to calculate BMI.  General Appearance: Casual  Eye Contact:  Fair  Speech:  Clear and Coherent  Volume:  Normal  Mood:  Euthymic  Affect:  Congruent  Thought Process:  Goal Directed and Descriptions of Associations: Intact  Orientation:  Full (Time, Place, and Person)  Thought Content: Logical   Suicidal Thoughts:  No  Homicidal Thoughts:  No  Memory:  Immediate;   Fair Recent;   Fair Remote;   Fair  Judgement:  Fair  Insight:  Fair  Psychomotor Activity:  Normal  Concentration:  Concentration: Fair and Attention Span: Fair  Recall:  [employer] of Knowledge: Fair  Language: Fair  Akathisia:  No  Handed:  Right  AIMS (if indicated): UTA  Assets:  Communication Skills Desire for Improvement Housing Social Support  ADL's:  Intact  Cognition: WNL  Sleep:  Fair   Screenings: PHQ2-9   Flowsheet Row Video Visit from 03/18/2020 in [facility] [place] Video Visit from 06/13/2019 in [facility] Office Visit from 12/03/2014 in [facility]  PHQ-2 Total Score 0 0 2  PHQ-9 Total Score -- 4 20    Flowsheet Row Video Visit from 03/18/2020 in [facility] No Risk       Assessment and Plan: Jamie Baxter is a 70 year old Caucasian female who has a history of MDD, panic attacks, GERD, thyroid cancer in remission, osteoarthritis was evaluated by telemedicine today.  Patient is biologically predisposed given her history of medical problems as well as trauma.  Patient is currently stable on current medication regimen.  Plan as noted below.  Plan MDD in full remission Prozac 60 mg p.o. daily Lamotrigine 150 mg p.o. twice daily Seroquel extended release 150 mg p.o. nightly Trazodone 150-200 mg p.o. nightly as needed  Panic  attacks-stable Prozac as prescribed  Insomnia-improving Seroquel extended release 150 mg p.o. nightly.  [facility].  She reports [facility] which [facility] currently on [facility] since it is too expensive for her.  [facility] and let [facility] know. Trazodone 150-200 mg p.o. nightly as needed  Discussed and reviewed most recent labs-she will continue to follow-up with primary care provider for abnormal labs.  Follow-up in clinic in 3 months in person.   I have spent at least 18 minutes non face to face with patient today which includes the time spent for preparing to see the patient  (e.g., review of test, records), ordering medications and test, psychoeducation and supportive psychotherapy, care coordination as well as documenting clinical information in electronic health record.  This note was generated in part or whole with voice recognition software. Voice recognition is usually quite accurate but there are transcription errors that can and very often do occur. I apologize for any typographical errors that were not detected and corrected.         Ursula Alert, MD 05/06/2020, 3:59 PM

## 2020-05-16 DIAGNOSIS — E785 Hyperlipidemia, unspecified: Secondary | ICD-10-CM | POA: Diagnosis not present

## 2020-05-16 DIAGNOSIS — Z9181 History of falling: Secondary | ICD-10-CM | POA: Diagnosis not present

## 2020-05-16 DIAGNOSIS — Z0000 Encounter for general adult medical examination without abnormal findings: Secondary | ICD-10-CM | POA: Diagnosis not present

## 2020-05-31 ENCOUNTER — Other Ambulatory Visit: Payer: Self-pay | Admitting: Psychiatry

## 2020-05-31 DIAGNOSIS — F3342 Major depressive disorder, recurrent, in full remission: Secondary | ICD-10-CM

## 2020-07-25 ENCOUNTER — Telehealth: Payer: Self-pay

## 2020-07-25 ENCOUNTER — Telehealth: Payer: Self-pay | Admitting: Psychiatry

## 2020-08-12 DIAGNOSIS — K219 Gastro-esophageal reflux disease without esophagitis: Secondary | ICD-10-CM | POA: Diagnosis not present

## 2020-08-12 DIAGNOSIS — Z136 Encounter for screening for cardiovascular disorders: Secondary | ICD-10-CM | POA: Diagnosis not present

## 2020-08-12 DIAGNOSIS — N1831 Chronic kidney disease, stage 3a: Secondary | ICD-10-CM | POA: Diagnosis not present

## 2020-08-12 DIAGNOSIS — Z79899 Other long term (current) drug therapy: Secondary | ICD-10-CM | POA: Diagnosis not present

## 2020-08-20 ENCOUNTER — Encounter: Payer: Self-pay | Admitting: Psychiatry

## 2020-08-20 ENCOUNTER — Ambulatory Visit (INDEPENDENT_AMBULATORY_CARE_PROVIDER_SITE_OTHER): Payer: Medicare Other | Admitting: Psychiatry

## 2020-08-20 ENCOUNTER — Other Ambulatory Visit: Payer: Self-pay

## 2020-08-20 VITALS — BP 133/82 | HR 89 | Temp 97.3°F | Wt 198.4 lb

## 2020-08-20 DIAGNOSIS — F3342 Major depressive disorder, recurrent, in full remission: Secondary | ICD-10-CM | POA: Diagnosis not present

## 2020-08-20 DIAGNOSIS — G4701 Insomnia due to medical condition: Secondary | ICD-10-CM

## 2020-08-20 DIAGNOSIS — F410 Panic disorder [episodic paroxysmal anxiety] without agoraphobia: Secondary | ICD-10-CM | POA: Diagnosis not present

## 2020-08-20 DIAGNOSIS — Z79899 Other long term (current) drug therapy: Secondary | ICD-10-CM | POA: Diagnosis not present

## 2020-08-20 NOTE — Progress Notes (Signed)
[facility] MD OP Progress Note  08/20/2020 3:46 PM EVERLI ROTHER  MRN:  [id number]  Chief Complaint:  Chief Complaint   Follow-up    HPI: SHELBIA SCINTO is a 70 year old Caucasian female, married, retired, lives in [place], has a history of MDD, panic disorder, osteoarthritis, history of thyroid cancer, GERD was evaluated in office today.  Patient today reports she is overall doing well.  Denies any significant mood swings.  Denies any anxiety or depressive symptoms.  She reports appetite is fair.  She does report that sleep gets disrupted intermittently few times a week.  She however is able to fall back asleep quickly and she feels rested when she wakes up in the morning.  She is compliant on the Seroquel as prescribed.  She also has trazodone available.  Patient denies any suicidality, homicidality or perceptual disturbances.  Collateral information was obtained from sister-Diane.  According to sister patient may have had some movements around her mouth however she has not noticed it recently.  According to patient she is not aware of this and likely relates this to her humming to a tune in her mind .  She will keep an eye on that with and will let writer know.  Patient denies any other concerns today.  Visit Diagnosis:    ICD-10-CM   1. MDD (major depressive disorder), recurrent, in full remission ([place])  F33.42     2. Panic disorder  F41.0     3. Insomnia due to medical condition  G47.01    mood, pain    4. High risk medication use  Z79.899 Prolactin    Hemoglobin A1C      Past Psychiatric History: I have reviewed past psychiatric history from progress note from 08/11/2018.  Past trials of Pristiq, Prozac, Lamictal, Seroquel  Past Medical History:  Past Medical History:  Diagnosis Date   Anxiety    Depression    GERD (gastroesophageal reflux disease)    Thyroid disease     Past Surgical History:  Procedure Laterality Date   ABSCESS DRAINAGE     BREAST BIOPSY     BREAST  SURGERY     breast biopsy   COLONOSCOPY  2006, 07/17/10   Normal (except melanosis), ? hx polyps in 1990's - [place]   rotator cuff     THYROID LOBECTOMY  1978   goiter   THYROIDECTOMY SUBSTERNAL  2010   left side/cancer/radioactive iodine 2 years in a row for Ca cells   VAGINAL HYSTERECTOMY     fiboid tumors    Family Psychiatric History: Reviewed family psychiatric history from progress note on 08/11/2018  Family History:  Family History  Problem Relation Age of Onset   GER disease Father    Rheum arthritis Sister    Healthy Sister    Hypertension Mother    Mental illness Neg Hx     Social History: Reviewed social history from progress note on 08/11/2018 Social History   Socioeconomic History   Marital status: Married    Spouse name: monroe   Number of children: 1   Years of education: Not on file   Highest education level: High school graduate  Occupational History   Not on file  Tobacco Use   Smoking status: Former    Types: Cigarettes    Quit date: 02/03/1976    Years since quitting: 44.5   Smokeless tobacco: Never  Vaping Use   Vaping Use: Never used  Substance and Sexual Activity   Alcohol use: No  Drug use: No   Sexual activity: Not Currently  Other Topics Concern   Not on file  Social History Narrative   Not on file   Social Determinants of Health   Financial Resource Strain: Not on file  Food Insecurity: Not on file  Transportation Needs: Not on file  Physical Activity: Not on file  Stress: Not on file  Social Connections: Not on file    Allergies:  Allergies  Allergen Reactions   Amantadine Other (See Comments)    disorientation   Benztropine    Geodon  [Ziprasidone Hcl] Other (See Comments)    disorientation   Sulfa Antibiotics    Z-Pak [Azithromycin]     hallucinations    Metabolic Disorder Labs: No results found for: HGBA1C, MPG No results found for: PROLACTIN No results found for: CHOL, TRIG, HDL, CHOLHDL, VLDL, LDLCALC No  results found for: TSH  Therapeutic Level Labs: No results found for: LITHIUM No results found for: VALPROATE No components found for:  CBMZ  Current Medications: Current Outpatient Medications  Medication Sig Dispense Refill   acetaminophen (TYLENOL) 325 MG tablet Take by mouth.     B Complex Vitamins (B COMPLEX PO) Take by mouth.     Calcium Carbonate-Vitamin D 600-400 MG-UNIT tablet Take by mouth.     celecoxib (CELEBREX) 100 MG capsule Take 100 mg by mouth 2 (two) times daily.     Coenzyme Q10 (COQ10 PO) Take by mouth.     etodolac (LODINE) 400 MG tablet Take by mouth.     FLUoxetine (PROZAC) 20 MG capsule TAKE 1 CAPSULE BY MOUTH  DAILY TO BE COMBINED WITH  40 MG 90 capsule 3   FLUoxetine (PROZAC) 40 MG capsule TAKE 1 CAPSULE BY MOUTH  DAILY TO BE COMBINED WITH  20 MG (TOTAL 60MG  DAILY) 90 capsule 3   fluticasone (FLONASE) 50 MCG/ACT nasal spray   1   GLUCOSAMINE-CHONDROITIN PO Take by mouth.     guaiFENesin (MUCINEX) 600 MG 12 hr tablet Take by mouth.     hydrOXYzine (VISTARIL) 25 MG capsule TAKE 1 CAP BY MOUTH DAILY AS NEEDED TO BE TAKE 1-3 TIMES A WEEK AS NEEDED FOR SEVERE PANIC ATTACKS 45 capsule 1   lamoTRIgine (LAMICTAL) 150 MG tablet TAKE 1 TABLET BY MOUTH  TWICE DAILY 180 tablet 3   levothyroxine (SYNTHROID) 175 MCG tablet Take 150 mcg by mouth.      loratadine (CLARITIN REDITABS) 10 MG dissolvable tablet Take by mouth.     MILK THISTLE PO Take by mouth.     Multiple Vitamin (MULTI-VITAMIN) tablet Take 1 tablet by mouth daily.     omeprazole (PRILOSEC) 40 MG capsule TAKE 1 CAPSULE BY MOUTH EVERY DAY 90 capsule 0   QUEtiapine Fumarate (SEROQUEL XR) 150 MG 24 hr tablet TAKE 1 TABLET BY MOUTH AT BEDTIME. 90 tablet 1   SM CALCIUM 600/VITAMIN D 600-400 MG-UNIT tablet Take 1 tablet by mouth 2 (two) times daily.     SYNTHROID 150 MCG tablet Take 150 mcg by mouth daily.     thyroid (ARMOUR) 120 MG tablet Take by mouth.     traZODone (DESYREL) 100 MG tablet TAKE 1 AND 1/2 TO 2  TABLETS BY MOUTH AT BEDTIME AS  NEEDED FOR SLEEP 180 tablet 3   triamcinolone cream (KENALOG) 0.1 % TRIAMCINOLONE ACETONIDE, 0.1% (External Cream)  1 (one) Cream Cream use as directed for 0 days  Quantity: 30;  Refills: 1   Ordered :12-Jan-2014  Meyer Cory ;  Started  12-Jan-2014 Active     VITAMIN D PO Take by mouth.     VITAMIN E PO Take by mouth.     Current Facility-Administered Medications  Medication Dose Route Frequency Provider Last Rate Last Admin   0.9 %  sodium chloride infusion  500 mL Intravenous Continuous Gatha Mayer, MD         Musculoskeletal: Strength & Muscle Tone: within normal limits Gait & Station: normal Patient leans: N/A  Psychiatric Specialty Exam: Review of Systems  Psychiatric/Behavioral:  Negative for agitation, behavioral problems, confusion, decreased concentration, dysphoric mood, hallucinations, self-injury, sleep disturbance and suicidal ideas. The patient is not nervous/anxious and is not hyperactive.   All other systems reviewed and are negative.  Blood pressure 133/82, pulse 89, temperature (!) 97.3 F (36.3 C), temperature source Temporal, weight 198 lb 6.4 oz (90 kg).Body mass index is 29.3 kg/m.  General Appearance: Casual  Eye Contact:  Good  Speech:  Clear and Coherent  Volume:  Normal  Mood:  Euthymic  Affect:  Congruent  Thought Process:  Goal Directed and Descriptions of Associations: Intact  Orientation:  Full (Time, Place, and Person)  Thought Content: Logical   Suicidal Thoughts:  No  Homicidal Thoughts:  No  Memory:  Immediate;   Fair Recent;   Fair Remote;   Fair  Judgement:  Fair  Insight:  Fair  Psychomotor Activity:  Normal  Concentration:  Concentration: Fair and Attention Span: Fair  Recall:  [employer] of Knowledge: Fair  Language: Fair  Akathisia:  No  Handed:  Right  AIMS (if indicated): done  Assets:  Communication Skills Desire for Improvement Housing Social Support  ADL's:  Intact   Cognition: WNL  Sleep:  Fair   Screenings: [place] Office Visit from 08/20/2020 in [place] [facility] Total Score 0      [place] Office Visit from 08/20/2020 in [facility]  Total GAD-7 Score 0      PHQ2-9    [place] Visit from 08/20/2020 in [facility] Video Visit from 03/18/2020 in [place] Video Visit from 06/13/2019 in [place] Visit from 12/03/2014 in [facility]  PHQ-2 Total Score 0 0 0 2  PHQ-9 Total Score 2 -- 4 20      [place] Visit from 08/20/2020 in [place] Video Visit from 03/18/2020 in [place] No Risk No Risk        Assessment and Plan: KEISHAWNA CARRANZA is a 70 year old Caucasian female who has a history of MDD, panic attacks, GERD, thyroid cancer in remission, osteoarthritis was evaluated in office today.  Patient is currently stable.  Plan as noted below.  Plan MDD in full remission Prozac 60 mg p.o. daily Lamotrigine 150 mg p.o. twice daily Seroquel extended release 150 mg p.o. nightly   Panic attacks-stable Prozac as prescribed  Insomnia-stable Seroquel extended release 150 mg manage nightly Trazodone 150-200 mg po nightly as needed  High risk medication use-will order prolactin, hemoglobin level.  Provided lab slip to patient.  Long-term plan is to taper off Seroquel.  This was discussed with patient.  Provided education including information about tardive dyskinesia was provided.  Patient will let writer know when she is ready.  Collateral information obtained from [place] who was present in session today.  I have spent atleast  30 minutes face to face with patient today which includes the time spent for preparing to see the patient (  e.g., review of test, records ), obtaining and to review and separately obtained history , ordering medications and test ,psychoeducation and supportive psychotherapy and care coordination,as well as documenting clinical information in electronic health record,interpreting and communication of test results   Follow-up in clinic in 3 months in office.  This note was generated in part or whole with voice recognition software. Voice recognition is usually quite accurate but there are transcription errors that can and very often do occur. I apologize for any typographical errors that were not detected and corrected.     Ursula Alert, MD 08/20/2020, 3:46 PM

## 2020-09-19 ENCOUNTER — Other Ambulatory Visit
Admission: RE | Admit: 2020-09-19 | Discharge: 2020-09-19 | Disposition: A | Discharge status: Home / Self Care | Payer: Medicare Other | Source: Ambulatory Visit | Attending: Psychiatry | Admitting: Psychiatry

## 2020-09-19 DIAGNOSIS — Z79899 Other long term (current) drug therapy: Secondary | ICD-10-CM | POA: Diagnosis not present

## 2020-09-19 LAB — HEMOGLOBIN A1C
Hgb A1c MFr Bld: 5.7 % — ABNORMAL HIGH (ref 4.8–5.6)
Mean Plasma Glucose: 116.89 mg/dL

## 2020-09-20 ENCOUNTER — Other Ambulatory Visit: Payer: Self-pay | Admitting: Psychiatry

## 2020-09-20 DIAGNOSIS — F3342 Major depressive disorder, recurrent, in full remission: Secondary | ICD-10-CM

## 2020-09-21 LAB — PROLACTIN: Prolactin: 15.8 ng/mL (ref 4.8–23.3)

## 2020-10-05 ENCOUNTER — Other Ambulatory Visit: Payer: Self-pay | Admitting: Psychiatry

## 2020-10-05 DIAGNOSIS — F3341 Major depressive disorder, recurrent, in partial remission: Secondary | ICD-10-CM

## 2020-10-05 DIAGNOSIS — F410 Panic disorder [episodic paroxysmal anxiety] without agoraphobia: Secondary | ICD-10-CM

## 2020-10-05 DIAGNOSIS — F3342 Major depressive disorder, recurrent, in full remission: Secondary | ICD-10-CM

## 2020-10-18 ENCOUNTER — Other Ambulatory Visit: Payer: Self-pay | Admitting: Psychiatry

## 2020-10-18 DIAGNOSIS — F331 Major depressive disorder, recurrent, moderate: Secondary | ICD-10-CM

## 2020-10-18 DIAGNOSIS — F410 Panic disorder [episodic paroxysmal anxiety] without agoraphobia: Secondary | ICD-10-CM

## 2020-10-30 NOTE — Telephone Encounter (Signed)
CLOSE

## 2020-11-08 DIAGNOSIS — Z23 Encounter for immunization: Secondary | ICD-10-CM | POA: Diagnosis not present

## 2020-11-21 DIAGNOSIS — M159 Polyosteoarthritis, unspecified: Secondary | ICD-10-CM | POA: Diagnosis not present

## 2020-11-21 DIAGNOSIS — M85851 Other specified disorders of bone density and structure, right thigh: Secondary | ICD-10-CM | POA: Diagnosis not present

## 2020-11-21 DIAGNOSIS — K51919 Ulcerative colitis, unspecified with unspecified complications: Secondary | ICD-10-CM | POA: Diagnosis not present

## 2020-11-21 DIAGNOSIS — Z791 Long term (current) use of non-steroidal anti-inflammatories (NSAID): Secondary | ICD-10-CM | POA: Diagnosis not present

## 2020-11-26 ENCOUNTER — Encounter: Payer: Self-pay | Admitting: Psychiatry

## 2020-11-26 ENCOUNTER — Ambulatory Visit (INDEPENDENT_AMBULATORY_CARE_PROVIDER_SITE_OTHER): Payer: Medicare Other | Admitting: Psychiatry

## 2020-11-26 ENCOUNTER — Other Ambulatory Visit: Payer: Self-pay

## 2020-11-26 VITALS — BP 113/77 | HR 86 | Temp 98.3°F | Wt 201.8 lb

## 2020-11-26 DIAGNOSIS — G4701 Insomnia due to medical condition: Secondary | ICD-10-CM

## 2020-11-26 DIAGNOSIS — Z79899 Other long term (current) drug therapy: Secondary | ICD-10-CM

## 2020-11-26 DIAGNOSIS — F3342 Major depressive disorder, recurrent, in full remission: Secondary | ICD-10-CM

## 2020-11-26 DIAGNOSIS — F410 Panic disorder [episodic paroxysmal anxiety] without agoraphobia: Secondary | ICD-10-CM

## 2020-11-26 MED ORDER — HYDROXYZINE PAMOATE 25 MG PO CAPS: ORAL_CAPSULE | ORAL | 1 refills | Status: DC

## 2020-11-26 MED ORDER — QUETIAPINE FUMARATE ER 50 MG PO TB24: 100.0000 mg | ORAL_TABLET | Freq: Every day | ORAL | 0 refills | Status: DC

## 2020-11-26 NOTE — Progress Notes (Signed)
[facility] MD OP Progress Note  11/26/2020 10:40 AM Jamie Baxter  MRN:  [id number]  Chief Complaint:  Chief Complaint   Follow-up; Anxiety; Depression    HPI: Jamie Baxter is a 70 year old Caucasian female, married, retired, lives in [place], has a history of MDD, panic disorder, osteoarthritis, history of thyroid cancer, GERD was evaluated in office today.  Patient today reports overall she is doing fairly well.  Denies any significant depression, anxiety.  She reports sleep is overall good.  She does have some racing thoughts especially at night however she has been coping okay and has been able to sleep.  She is compliant on her medications.  Denies side effects.  Patient reports psychosocial stressors of her husband going through shoulder surgery and she being the primary caregiver.  However her husband is currently recovering well.  That is a relief for her.  Patient agreeable to being tapered off of the Seroquel since she has been on this medication for a very long time, discussed long-term side effects.  Patient denies any suicidality, homicidality or perceptual disturbances.  Patient denies any other concerns today.  Visit Diagnosis:    ICD-10-CM   1. MDD (major depressive disorder), recurrent, in full remission ([place])  F33.42 QUEtiapine (SEROQUEL XR) 50 MG TB24 24 hr tablet    2. Panic disorder  F41.0 QUEtiapine (SEROQUEL XR) 50 MG TB24 24 hr tablet    hydrOXYzine (VISTARIL) 25 MG capsule    3. Insomnia due to medical condition  G47.01 QUEtiapine (SEROQUEL XR) 50 MG TB24 24 hr tablet   Mood , pain    4. High risk medication use  Z79.899 Lipid panel      Past Psychiatric History: Reviewed past psychiatric history from progress note on 08/11/2018.  Past trials of Pristiq, Prozac, Lamictal, Seroquel  Past Medical History:  Past Medical History:  Diagnosis Date   Anxiety    Depression    GERD (gastroesophageal reflux disease)    Thyroid disease     Past Surgical History:   Procedure Laterality Date   ABSCESS DRAINAGE     BREAST BIOPSY     BREAST SURGERY     breast biopsy   COLONOSCOPY  2006, 07/17/10   Normal (except melanosis), ? hx polyps in 1990's - [place]   rotator cuff     THYROID LOBECTOMY  1978   goiter   THYROIDECTOMY SUBSTERNAL  2010   left side/cancer/radioactive iodine 2 years in a row for Ca cells   VAGINAL HYSTERECTOMY     fiboid tumors    Family Psychiatric History: Reviewed family psychiatric history from progress note on 08/11/2018  Family History:  Family History  Problem Relation Age of Onset   GER disease Father    Rheum arthritis Sister    Healthy Sister    Hypertension Mother    Mental illness Neg Hx     Social History: Reviewed social history from progress note on 08/11/2018 Social History   Socioeconomic History   Marital status: Married    Spouse name: Jamie Baxter   Number of children: 1   Years of education: Not on file   Highest education level: High school graduate  Occupational History   Not on file  Tobacco Use   Smoking status: Former    Types: Cigarettes    Quit date: 02/03/1976    Years since quitting: 44.8   Smokeless tobacco: Never  Vaping Use   Vaping Use: Never used  Substance and Sexual Activity   Alcohol  use: No   Drug use: No   Sexual activity: Not Currently  Other Topics Concern   Not on file  Social History Narrative   Not on file   Social Determinants of Health   Financial Resource Strain: Not on file  Food Insecurity: Not on file  Transportation Needs: Not on file  Physical Activity: Not on file  Stress: Not on file  Social Connections: Not on file    Allergies:  Allergies  Allergen Reactions   Amantadine Other (See Comments)    disorientation   Benztropine    Geodon  [Ziprasidone Hcl] Other (See Comments)    disorientation   Sulfa Antibiotics    Z-Pak [Azithromycin]     hallucinations    Metabolic Disorder Labs: Lab Results  Component Value Date   HGBA1C 5.7 (H)  09/19/2020   MPG 116.89 09/19/2020   Lab Results  Component Value Date   PROLACTIN 15.8 09/19/2020   No results found for: CHOL, TRIG, HDL, CHOLHDL, VLDL, LDLCALC No results found for: TSH  Therapeutic Level Labs: No results found for: LITHIUM No results found for: VALPROATE No components found for:  CBMZ  Current Medications: Current Outpatient Medications  Medication Sig Dispense Refill   acetaminophen (TYLENOL) 325 MG tablet Take by mouth.     B Complex Vitamins (B COMPLEX PO) Take by mouth.     Calcium Carbonate-Vitamin D 600-400 MG-UNIT tablet Take by mouth.     celecoxib (CELEBREX) 100 MG capsule Take 100 mg by mouth 2 (two) times daily.     Coenzyme Q10 (COQ10 PO) Take by mouth.     FLUoxetine (PROZAC) 20 MG capsule TAKE 1 CAPSULE BY MOUTH  DAILY TO BE COMBINED WITH  40 MG 90 capsule 3   FLUoxetine (PROZAC) 40 MG capsule TAKE 1 CAPSULE BY MOUTH  DAILY TO BE COMBINED WITH  20 MG (TOTAL 60MG  DAILY) 90 capsule 3   fluticasone (FLONASE) 50 MCG/ACT nasal spray   1   GLUCOSAMINE-CHONDROITIN PO Take by mouth.     lamoTRIgine (LAMICTAL) 150 MG tablet TAKE 1 TABLET BY MOUTH  TWICE DAILY 180 tablet 3   MILK THISTLE PO Take by mouth.     Multiple Vitamin (MULTI-VITAMIN) tablet Take 1 tablet by mouth daily.     omeprazole (PRILOSEC) 40 MG capsule TAKE 1 CAPSULE BY MOUTH EVERY DAY 90 capsule 0   QUEtiapine (SEROQUEL XR) 50 MG TB24 24 hr tablet Take 2 tablets (100 mg total) by mouth at bedtime. 180 tablet 0   QUEtiapine Fumarate (SEROQUEL XR) 150 MG 24 hr tablet TAKE 1 TABLET BY MOUTH AT BEDTIME. 90 tablet 1   SM CALCIUM 600/VITAMIN D 600-400 MG-UNIT tablet Take 1 tablet by mouth 2 (two) times daily.     SYNTHROID 150 MCG tablet Take 150 mcg by mouth daily.     traZODone (DESYREL) 100 MG tablet TAKE 1 AND 1/2 TO 2 TABLETS BY MOUTH AT BEDTIME AS  NEEDED FOR SLEEP 180 tablet 3   triamcinolone cream (KENALOG) 0.1 % TRIAMCINOLONE ACETONIDE, 0.1% (External Cream)  1 (one) Cream Cream use as  directed for 0 days  Quantity: 30;  Refills: 1   Ordered :12-Jan-2014  Meyer Cory ;  Started 12-Jan-2014 Active     hydrOXYzine (VISTARIL) 25 MG capsule TAKE 1 CAP BY MOUTH DAILY AS NEEDED TO BE TAKE 1-3 TIMES A WEEK AS NEEDED FOR SEVERE PANIC ATTACKS 90 capsule 1   Current Facility-Administered Medications  Medication Dose Route Frequency Provider Last Rate Last  Admin   0.9 %  sodium chloride infusion  500 mL Intravenous Continuous Gatha Mayer, MD         Musculoskeletal: Strength & Muscle Tone: within normal limits Gait & Station: normal Patient leans: N/A  Psychiatric Specialty Exam: Review of Systems  Psychiatric/Behavioral:  Negative for agitation. The patient is nervous/anxious.   All other systems reviewed and are negative.  Blood pressure 113/77, pulse 86, temperature 98.3 F (36.8 C), temperature source Temporal, weight 201 lb 12.8 oz (91.5 kg).Body mass index is 29.8 kg/m.  General Appearance: Casual  Eye Contact:  Fair  Speech:  Clear and Coherent  Volume:  Normal  Mood:  Anxious coping well  Affect:  Congruent  Thought Process:  Goal Directed and Descriptions of Associations: Intact  Orientation:  Full (Time, Place, and Person)  Thought Content: Logical   Suicidal Thoughts:  No  Homicidal Thoughts:  No  Memory:  Immediate;   Fair Recent;   Fair Remote;   Fair  Judgement:  Fair  Insight:  Fair  Psychomotor Activity:  Normal  Concentration:  Concentration: Fair and Attention Span: Fair  Recall:  [employer] of Knowledge: Fair  Language: Fair  Akathisia:  No  Handed:  Right  AIMS (if indicated): done  Assets:  Communication Skills Desire for Improvement Housing Social Support  ADL's:  Intact  Cognition: WNL  Sleep:  Fair   Screenings: [place] Office Visit from 11/26/2020 in [place] Office Visit from 08/20/2020 in [place] Total Score 0 0      [place] Visit from 08/20/2020 in [place]  Total GAD-7 Score 0      PHQ2-9    [place] Visit from 11/26/2020 in [place] Visit from 08/20/2020 in [place] Video Visit from 03/18/2020 in [place] Video Visit from 06/13/2019 in [place] Visit from 12/03/2014 in [facility]  PHQ-2 Total Score 0 0 0 0 2  PHQ-9 Total Score -- 2 -- 4 20      [place] Office Visit from 08/20/2020 in [place] Video Visit from 03/18/2020 in [place] No Risk No Risk        Assessment and Plan: Jamie Baxter is a 70 year old Caucasian female who has a history of MDD, panic attacks, GERD, thyroid cancer in remission, osteoarthritis was evaluated in office today.  Patient being stable on current medication, agreeable to being tapered down off of the Seroquel due to risk of long-term side effects.  Discussed plan as noted below.  Plan MDD in full remission Prozac 60 mg p.o. daily Lamotrigine 150 mg p.o. twice daily Reduce Seroquel extended release to 100 mg p.o. nightly. AIMS - 0  Panic attacks-stable Prozac as prescribed  Insomnia-stable Seroquel extended release reduced to 100 mg p.o. nightly Trazodone 200 mg p.o. nightly as needed Patient could use hydroxyzine 25 mg as needed at bedtime for sleep and racing thoughts.  High risk medication use-will order lipid panel.  She will go to [facility] lab.  I have reviewed and discussed hemoglobin A1c dated 09/19/2020-5.7-prediabetic. Prolactin-15.8-within normal limits  Follow-up in clinic in 6 to 7 weeks or sooner in person.  This note was generated in part or whole with voice recognition software. Voice recognition is usually quite accurate but  there are  transcription errors that can and very often do occur. I apologize for any typographical errors that were not detected and corrected.     Ursula Alert, MD 11/26/2020, 10:40 AM

## 2020-11-28 ENCOUNTER — Telehealth: Payer: Self-pay | Admitting: Psychiatry

## 2020-11-28 ENCOUNTER — Other Ambulatory Visit
Admission: RE | Admit: 2020-11-28 | Discharge: 2020-11-28 | Disposition: A | Discharge status: Home / Self Care | Payer: Medicare Other | Attending: Psychiatry | Admitting: Psychiatry

## 2020-11-28 DIAGNOSIS — Z79899 Other long term (current) drug therapy: Secondary | ICD-10-CM | POA: Diagnosis not present

## 2020-11-28 LAB — LIPID PANEL
Cholesterol: 235 mg/dL — ABNORMAL HIGH (ref 0–200)
HDL: 63 mg/dL (ref >40)
LDL Cholesterol: 153 mg/dL — ABNORMAL HIGH (ref 0–99)
Total CHOL/HDL Ratio: 3.7 RATIO
Triglycerides: 94 mg/dL (ref <150)
VLDL: 19 mg/dL (ref 0–40)

## 2020-11-28 NOTE — Telephone Encounter (Signed)
Contacted patient to discuss her elevated cholesterol, LDL, normal.  Patient to follow up with primary care provider.  Discussed diet management, exercise.

## 2020-12-02 ENCOUNTER — Other Ambulatory Visit: Payer: Self-pay | Admitting: Psychiatry

## 2020-12-02 DIAGNOSIS — F3342 Major depressive disorder, recurrent, in full remission: Secondary | ICD-10-CM

## 2020-12-25 DIAGNOSIS — T1511XA Foreign body in conjunctival sac, right eye, initial encounter: Secondary | ICD-10-CM | POA: Diagnosis not present

## 2021-01-15 ENCOUNTER — Other Ambulatory Visit: Payer: Self-pay

## 2021-01-15 ENCOUNTER — Encounter: Payer: Self-pay | Admitting: Psychiatry

## 2021-01-15 ENCOUNTER — Ambulatory Visit (INDEPENDENT_AMBULATORY_CARE_PROVIDER_SITE_OTHER): Payer: Medicare Other | Admitting: Psychiatry

## 2021-01-15 VITALS — BP 125/80 | HR 78 | Ht 67.32 in | Wt 201.6 lb

## 2021-01-15 DIAGNOSIS — G4701 Insomnia due to medical condition: Secondary | ICD-10-CM | POA: Diagnosis not present

## 2021-01-15 DIAGNOSIS — F3342 Major depressive disorder, recurrent, in full remission: Secondary | ICD-10-CM

## 2021-01-15 DIAGNOSIS — Z79899 Other long term (current) drug therapy: Secondary | ICD-10-CM

## 2021-01-15 DIAGNOSIS — F410 Panic disorder [episodic paroxysmal anxiety] without agoraphobia: Secondary | ICD-10-CM

## 2021-01-15 MED ORDER — QUETIAPINE FUMARATE ER 50 MG PO TB24: 50.0000 mg | ORAL_TABLET | Freq: Every day | ORAL | 0 refills | Status: DC

## 2021-01-15 NOTE — Progress Notes (Signed)
[facility] OP Progress Note  01/15/2021 10:27 AM DEMECIA Baxter  MRN:  [id number]  Chief Complaint:  Chief Complaint   Follow-up; Anxiety; Depression    HPI: Jamie Baxter is a 70 year old Caucasian female, married, retired, lives in [place], has a history of MDD, panic disorder, osteoarthritis, history of thyroid cancer, GERD was evaluated in office today.  Patient today reports she has been taking only 1/2 tablet of 150 mg since she had a lot of supplies left.  She reports although she had some initial problems with her sleep when she started taking it ,it started getting better and currently she is doing well.  She reports mood wise she is doing okay.  Denies any significant sadness or anxiety.  Denies any racing thoughts.  Patient reports appetite is fair.  Patient denies any suicidality, homicidality or perceptual disturbances.  She is compliant on all her medications, denies side effects.  Patient reports she keeps herself busy spending time with her family, her prayer group as well as working on crossword puzzles.  Patient denies any other concerns today.  Visit Diagnosis:    ICD-10-CM   1. MDD (major depressive disorder), recurrent, in full remission ([place])  F33.42 QUEtiapine (SEROQUEL XR) 50 MG TB24 24 hr tablet    2. Panic disorder  F41.0 QUEtiapine (SEROQUEL XR) 50 MG TB24 24 hr tablet    3. Insomnia due to medical condition  G47.01 QUEtiapine (SEROQUEL XR) 50 MG TB24 24 hr tablet   Mood , pain    4. High risk medication use  Z79.899       Past Psychiatric History: Reviewed past psychiatric history from progress note on 08/11/2018.  Past trials of Pristiq, Prozac, Lamictal, Seroquel  Past Medical History:  Past Medical History:  Diagnosis Date   Anxiety    Depression    GERD (gastroesophageal reflux disease)    Thyroid disease     Past Surgical History:  Procedure Laterality Date   ABSCESS DRAINAGE     BREAST BIOPSY     BREAST SURGERY     breast biopsy    COLONOSCOPY  2006, 07/17/10   Normal (except melanosis), ? hx polyps in 1990's - [place]   rotator cuff     THYROID LOBECTOMY  1978   goiter   THYROIDECTOMY SUBSTERNAL  2010   left side/cancer/radioactive iodine 2 years in a row for Ca cells   VAGINAL HYSTERECTOMY     fiboid tumors    Family Psychiatric History: Reviewed family psychiatric history from progress note on 08/11/2018  Family History:  Family History  Problem Relation Age of Onset   GER disease Father    Rheum arthritis Sister    Healthy Sister    Hypertension Mother    Mental illness Neg Hx     Social History: Reviewed social history from progress note on 08/11/2018 Social History   Socioeconomic History   Marital status: Married    Spouse name: monroe   Number of children: 1   Years of education: Not on file   Highest education level: High school graduate  Occupational History   Not on file  Tobacco Use   Smoking status: Former    Types: Cigarettes    Quit date: 02/03/1976    Years since quitting: 44.9   Smokeless tobacco: Never  Vaping Use   Vaping Use: Never used  Substance and Sexual Activity   Alcohol use: No   Drug use: No   Sexual activity: Not Currently  Other  Topics Concern   Not on file  Social History Narrative   Not on file   Social Determinants of Health   Financial Resource Strain: Not on file  Food Insecurity: Not on file  Transportation Needs: Not on file  Physical Activity: Not on file  Stress: Not on file  Social Connections: Not on file    Allergies:  Allergies  Allergen Reactions   Amantadine Other (See Comments)    disorientation   Benztropine    Geodon  [Ziprasidone Hcl] Other (See Comments)    disorientation   Sulfa Antibiotics    Z-Pak [Azithromycin]     hallucinations    Metabolic Disorder Labs: Lab Results  Component Value Date   HGBA1C 5.7 (H) 09/19/2020   MPG 116.89 09/19/2020   Lab Results  Component Value Date   PROLACTIN 15.8 09/19/2020   Lab  Results  Component Value Date   CHOL 235 (H) 11/28/2020   TRIG 94 11/28/2020   HDL 63 11/28/2020   CHOLHDL 3.7 11/28/2020   VLDL 19 11/28/2020   LDLCALC 153 (H) 11/28/2020   No results found for: TSH  Therapeutic Level Labs: No results found for: LITHIUM No results found for: VALPROATE No components found for:  CBMZ  Current Medications: Current Outpatient Medications  Medication Sig Dispense Refill   acetaminophen (TYLENOL) 325 MG tablet Take by mouth.     B Complex Vitamins (B COMPLEX PO) Take by mouth.     Calcium Carbonate-Vitamin D 600-400 MG-UNIT tablet Take by mouth.     celecoxib (CELEBREX) 100 MG capsule Take 100 mg by mouth 2 (two) times daily.     Coenzyme Q10 (COQ10 PO) Take by mouth.     FLUoxetine (PROZAC) 20 MG capsule TAKE 1 CAPSULE BY MOUTH  DAILY TO BE COMBINED WITH  40 MG 90 capsule 3   FLUoxetine (PROZAC) 40 MG capsule TAKE 1 CAPSULE BY MOUTH  DAILY TO BE COMBINED WITH  20 MG (TOTAL 60MG  DAILY) 90 capsule 3   fluticasone (FLONASE) 50 MCG/ACT nasal spray   1   GLUCOSAMINE-CHONDROITIN PO Take by mouth.     hydrOXYzine (VISTARIL) 25 MG capsule TAKE 1 CAP BY MOUTH DAILY AS NEEDED TO BE TAKE 1-3 TIMES A WEEK AS NEEDED FOR SEVERE PANIC ATTACKS 90 capsule 1   lamoTRIgine (LAMICTAL) 150 MG tablet TAKE 1 TABLET BY MOUTH  TWICE DAILY 180 tablet 3   MILK THISTLE PO Take by mouth.     Multiple Vitamin (MULTI-VITAMIN) tablet Take 1 tablet by mouth daily.     omeprazole (PRILOSEC) 40 MG capsule TAKE 1 CAPSULE BY MOUTH EVERY DAY 90 capsule 0   SM CALCIUM 600/VITAMIN D 600-400 MG-UNIT tablet Take 1 tablet by mouth 2 (two) times daily.     SYNTHROID 150 MCG tablet Take 150 mcg by mouth daily.     traZODone (DESYREL) 100 MG tablet TAKE 1 AND 1/2 TO 2 TABLETS BY MOUTH AT BEDTIME AS  NEEDED FOR SLEEP 180 tablet 3   triamcinolone cream (KENALOG) 0.1 % TRIAMCINOLONE ACETONIDE, 0.1% (External Cream)  1 (one) Cream Cream use as directed for 0 days  Quantity: 30;  Refills: 1    Ordered :12-Jan-2014  Meyer Cory ;  Started 12-Jan-2014 Active     QUEtiapine (SEROQUEL XR) 50 MG TB24 24 hr tablet Take 1 tablet (50 mg total) by mouth at bedtime. Dose change 180 tablet 0   Current Facility-Administered Medications  Medication Dose Route Frequency Provider Last Rate Last Admin   0.9 %  sodium chloride infusion  500 mL Intravenous Continuous Gatha Mayer, MD         Musculoskeletal: Strength & Muscle Tone: within normal limits Gait & Station: normal Patient leans: N/A  Psychiatric Specialty Exam: Review of Systems  Psychiatric/Behavioral:  Negative for agitation, behavioral problems, confusion, decreased concentration, dysphoric mood, hallucinations, self-injury, sleep disturbance and suicidal ideas. The patient is not nervous/anxious and is not hyperactive.   All other systems reviewed and are negative.  Blood pressure 125/80, pulse 78, height 5' 7.32" (1.71 m), weight 201 lb 9.6 oz (91.4 kg).Body mass index is 31.27 kg/m.  General Appearance: Casual  Eye Contact:  Fair  Speech:  Clear and Coherent  Volume:  Normal  Mood:  Euthymic  Affect:  Congruent  Thought Process:  Goal Directed and Descriptions of Associations: Intact  Orientation:  Full (Time, Place, and Person)  Thought Content: Logical   Suicidal Thoughts:  No  Homicidal Thoughts:  No  Memory:  Immediate;   Fair Recent;   Fair Remote;   Fair  Judgement:  Fair  Insight:  Fair  Psychomotor Activity:  Normal  Concentration:  Concentration: Fair and Attention Span: Fair  Recall:  [employer] of Knowledge: Fair  Language: Fair  Akathisia:  No  Handed:  Right  AIMS (if indicated): done, 0  Assets:  Communication Skills Desire for [place] Talents/Skills Transportation  ADL's:  Intact  Cognition: WNL  Sleep:  Fair   Screenings: [place] Office Visit from 11/26/2020 in [place] Office Visit from  08/20/2020 in [place] [facility] Total Score 0 0      GAD-7    [place] Visit from 01/15/2021 in [place] Office Visit from 08/20/2020 in [place]  Total GAD-7 Score 0 0      PHQ2-9    [place] Visit from 01/15/2021 in [facility] Visit from 11/26/2020 in [place] Visit from 08/20/2020 in [place] Video Visit from 03/18/2020 in [place] Video Visit from 06/13/2019 in [place]  PHQ-2 Total Score 0 0 0 0 0  PHQ-9 Total Score -- -- 2 -- 4      [place] Office Visit from 08/20/2020 in [place] Video Visit from 03/18/2020 in [place] [place] No Risk No Risk        Assessment and Plan: Jamie Baxter is a 70 year old Caucasian female who has a history of MDD, panic attack, GERD, thyroid cancer in remission, osteoarthritis was evaluated in office today.  Patient is currently doing well and is interested in being tapered off of the Seroquel further.  Discussed plan as noted below.  Plan MDD in full remission Prozac 60 mg p.o. daily Lamotrigine 150 mg p.o. twice daily Reduce Seroquel extended release to 50 mg p.o. nightly  Panic attacks-stable Prozac as prescribed  Insomnia-stable Seroquel, we will reduce to 50 mg p.o. nightly Trazodone 200 mg p.o. nightly as needed Hydroxyzine 25 mg as needed for sleep and racing thoughts.  High risk medication use-11/28/2020-reviewed lipid panel-cholesterol slightly elevated at 235.  We will continue to taper off Seroquel.  Patient will benefit from follow-up with primary care provider for management.  Follow-up in clinic in 6 weeks or sooner if needed.  This note was generated in part or  whole  with voice recognition software. Voice recognition is usually quite accurate but there are transcription errors that can and very often do occur. I apologize for any typographical errors that were not detected and corrected.      Ursula Alert, MD 01/15/2021, 10:27 AM

## 2021-01-27 ENCOUNTER — Other Ambulatory Visit: Payer: Self-pay | Admitting: Psychiatry

## 2021-01-27 DIAGNOSIS — F3342 Major depressive disorder, recurrent, in full remission: Secondary | ICD-10-CM

## 2021-01-27 DIAGNOSIS — G4701 Insomnia due to medical condition: Secondary | ICD-10-CM

## 2021-01-27 DIAGNOSIS — F410 Panic disorder [episodic paroxysmal anxiety] without agoraphobia: Secondary | ICD-10-CM

## 2021-03-05 ENCOUNTER — Other Ambulatory Visit: Payer: Self-pay

## 2021-03-05 ENCOUNTER — Encounter: Payer: Self-pay | Admitting: Psychiatry

## 2021-03-05 ENCOUNTER — Ambulatory Visit (INDEPENDENT_AMBULATORY_CARE_PROVIDER_SITE_OTHER): Payer: Medicare Other | Admitting: Psychiatry

## 2021-03-05 VITALS — BP 116/77 | HR 91 | Temp 98.5°F | Wt 198.6 lb

## 2021-03-05 DIAGNOSIS — F3342 Major depressive disorder, recurrent, in full remission: Secondary | ICD-10-CM | POA: Diagnosis not present

## 2021-03-05 DIAGNOSIS — F410 Panic disorder [episodic paroxysmal anxiety] without agoraphobia: Secondary | ICD-10-CM | POA: Diagnosis not present

## 2021-03-05 DIAGNOSIS — G4701 Insomnia due to medical condition: Secondary | ICD-10-CM

## 2021-03-05 MED ORDER — QUETIAPINE FUMARATE ER 50 MG PO TB24: 50.0000 mg | ORAL_TABLET | ORAL | 2 refills | Status: DC

## 2021-03-05 NOTE — Progress Notes (Signed)
[facility] MD OP Progress Note  03/05/2021 1:56 PM Jamie Baxter  MRN:  [id number]  Chief Complaint:  Chief Complaint   Follow-up 71 year old Caucasian female, with history of MDD, panic disorder, insomnia, presents for medication management.    HPI: Jamie Baxter is a 71 year old Caucasian female, married, retired, lives in [place], has a history of MDD, panic disorder, osteoarthritis, history of thyroid cancer, GERD was evaluated in office today.  Patient today reports she is currently taking a lower dose of Seroquel as discussed last visit and tolerating it well.  The first couple of nights she had nightmares however that resolved.  Patient reports she is sleeping through the night most of the nights.  Patient reports however every 5 weeks or so she has one night when she is wide awake and cannot sleep.  She tried taking 3 tablets of the trazodone 100 mg a few times and that may have helped.  Patient denies any significant depression or anxiety attacks.  Patient reports appetite is fair.  Patient denies any suicidality, homicidality or perceptual disturbances.  Patient denies any other concerns today.  Visit Diagnosis:    ICD-10-CM   1. MDD (major depressive disorder), recurrent, in full remission ([place])  F33.42 QUEtiapine (SEROQUEL XR) 50 MG TB24 24 hr tablet    2. Panic disorder  F41.0 QUEtiapine (SEROQUEL XR) 50 MG TB24 24 hr tablet    3. Insomnia due to medical condition  G47.01    mood, pain      Past Psychiatric History: Reviewed past psychiatric history from progress note on 08/11/2018.  Past trials of Pristiq, Prozac, Lamictal, Seroquel  Past Medical History:  Past Medical History:  Diagnosis Date   Anxiety    Depression    GERD (gastroesophageal reflux disease)    Thyroid disease     Past Surgical History:  Procedure Laterality Date   ABSCESS DRAINAGE     BREAST BIOPSY     BREAST SURGERY     breast biopsy   COLONOSCOPY  2006, 07/17/10   Normal (except melanosis), ? hx  polyps in 1990's - [place]   rotator cuff     THYROID LOBECTOMY  1978   goiter   THYROIDECTOMY SUBSTERNAL  2010   left side/cancer/radioactive iodine 2 years in a row for Ca cells   VAGINAL HYSTERECTOMY     fiboid tumors    Family Psychiatric History: Reviewed family psychiatric history from progress note on 08/11/2018.  Family History:  Family History  Problem Relation Age of Onset   GER disease Father    Rheum arthritis Sister    Healthy Sister    Hypertension Mother    Mental illness Neg Hx     Social History: Reviewed social history from progress note on 08/11/2018. Social History   Socioeconomic History   Marital status: Married    Spouse name: monroe   Number of children: 1   Years of education: Not on file   Highest education level: High school graduate  Occupational History   Not on file  Tobacco Use   Smoking status: Former    Types: Cigarettes    Quit date: 02/03/1976    Years since quitting: 45.1   Smokeless tobacco: Never  Vaping Use   Vaping Use: Never used  Substance and Sexual Activity   Alcohol use: No   Drug use: No   Sexual activity: Not Currently  Other Topics Concern   Not on file  Social History Narrative   Not on file  Social Determinants of Health   Financial Resource Strain: Not on file  Food Insecurity: Not on file  Transportation Needs: Not on file  Physical Activity: Not on file  Stress: Not on file  Social Connections: Not on file    Allergies:  Allergies  Allergen Reactions   Amantadine Other (See Comments)    disorientation   Benztropine    Geodon  [Ziprasidone Hcl] Other (See Comments)    disorientation   Sulfa Antibiotics    Z-Pak [Azithromycin]     hallucinations    Metabolic Disorder Labs: Lab Results  Component Value Date   HGBA1C 5.7 (H) 09/19/2020   MPG 116.89 09/19/2020   Lab Results  Component Value Date   PROLACTIN 15.8 09/19/2020   Lab Results  Component Value Date   CHOL 235 (H) 11/28/2020    TRIG 94 11/28/2020   HDL 63 11/28/2020   CHOLHDL 3.7 11/28/2020   VLDL 19 11/28/2020   LDLCALC 153 (H) 11/28/2020   No results found for: TSH  Therapeutic Level Labs: No results found for: LITHIUM No results found for: VALPROATE No components found for:  CBMZ  Current Medications: Current Outpatient Medications  Medication Sig Dispense Refill   acetaminophen (TYLENOL) 325 MG tablet Take by mouth.     B Complex Vitamins (B COMPLEX PO) Take by mouth.     Calcium Carbonate-Vitamin D 600-400 MG-UNIT tablet Take by mouth.     celecoxib (CELEBREX) 100 MG capsule Take 100 mg by mouth 2 (two) times daily.     Coenzyme Q10 (COQ10 PO) Take by mouth.     FLUoxetine (PROZAC) 20 MG capsule TAKE 1 CAPSULE BY MOUTH  DAILY TO BE COMBINED WITH  40 MG 90 capsule 3   FLUoxetine (PROZAC) 40 MG capsule TAKE 1 CAPSULE BY MOUTH  DAILY TO BE COMBINED WITH  20 MG (TOTAL 60MG  DAILY) 90 capsule 3   fluticasone (FLONASE) 50 MCG/ACT nasal spray   1   GLUCOSAMINE-CHONDROITIN PO Take by mouth.     hydrOXYzine (VISTARIL) 25 MG capsule TAKE 1 CAP BY MOUTH DAILY AS NEEDED TO BE TAKE 1-3 TIMES A WEEK AS NEEDED FOR SEVERE PANIC ATTACKS 90 capsule 1   lamoTRIgine (LAMICTAL) 150 MG tablet TAKE 1 TABLET BY MOUTH  TWICE DAILY 180 tablet 3   MILK THISTLE PO Take by mouth.     Multiple Vitamin (MULTI-VITAMIN) tablet Take 1 tablet by mouth daily.     omeprazole (PRILOSEC) 40 MG capsule TAKE 1 CAPSULE BY MOUTH EVERY DAY 90 capsule 0   SM CALCIUM 600/VITAMIN D 600-400 MG-UNIT tablet Take 1 tablet by mouth 2 (two) times daily.     SYNTHROID 150 MCG tablet Take 150 mcg by mouth daily.     traZODone (DESYREL) 100 MG tablet TAKE 1 AND 1/2 TO 2 TABLETS BY MOUTH AT BEDTIME AS  NEEDED FOR SLEEP 180 tablet 3   triamcinolone cream (KENALOG) 0.1 % TRIAMCINOLONE ACETONIDE, 0.1% (External Cream)  1 (one) Cream Cream use as directed for 0 days  Quantity: 30;  Refills: 1   Ordered :12-Jan-2014  Meyer Cory ;  Started  12-Jan-2014 Active     QUEtiapine (SEROQUEL XR) 50 MG TB24 24 hr tablet Take 1 tablet (50 mg total) by mouth as directed. Take 1 tablet every other day for next two weeks and then as needed after that 90 tablet 2   Current Facility-Administered Medications  Medication Dose Route Frequency Provider Last Rate Last Admin   0.9 %  sodium chloride  infusion  500 mL Intravenous Continuous Gatha Mayer, MD         Musculoskeletal: Strength & Muscle Tone: within normal limits Gait & Station: normal Patient leans: N/A  Psychiatric Specialty Exam: Review of Systems  Psychiatric/Behavioral:  Negative for agitation, behavioral problems, confusion, decreased concentration, dysphoric mood, hallucinations, self-injury, sleep disturbance and suicidal ideas. The patient is not nervous/anxious and is not hyperactive.   All other systems reviewed and are negative.  Blood pressure 116/77, pulse 91, temperature 98.5 F (36.9 C), temperature source Temporal, weight 198 lb 9.6 oz (90.1 kg).Body mass index is 30.81 kg/m.  General Appearance: Casual  Eye Contact:  Fair  Speech:  Clear and Coherent  Volume:  Normal  Mood:  Euthymic  Affect:  Congruent  Thought Process:  Goal Directed and Descriptions of Associations: Intact  Orientation:  Full (Time, Place, and Person)  Thought Content: Logical   Suicidal Thoughts:  No  Homicidal Thoughts:  No  Memory:  Immediate;   Fair Recent;   Fair Remote;   Fair  Judgement:  Fair  Insight:  Fair  Psychomotor Activity:  Normal  Concentration:  Concentration: Fair and Attention Span: Fair  Recall:  [employer] of Knowledge: Fair  Language: Fair  Akathisia:  No  Handed:  Right  AIMS (if indicated): done, 0  Assets:  Communication Skills Desire for [place] Housing Intimacy Social Support  ADL's:  Intact  Cognition: WNL  Sleep:  Fair   Screenings: [place] Office Visit from 11/26/2020 in [place]  Office Visit from 08/20/2020 in [place] Total Score 0 0      GAD-7    [place] Visit from 01/15/2021 in [place] Office Visit from 08/20/2020 in [place]  Total GAD-7 Score 0 0      PHQ2-9    [place] Visit from 03/05/2021 in [facility] [place] Visit from 01/15/2021 in [place] Visit from 11/26/2020 in [place] Visit from 08/20/2020 in [place] Video Visit from 03/18/2020 in [place]  PHQ-2 Total Score 0 0 0 0 0  PHQ-9 Total Score -- -- -- 2 --      [place] Visit from 08/20/2020 in [place] Video Visit from 03/18/2020 in [place] No Risk No Risk        Assessment and Plan: LULIA SCHRINER is a 71 year old Caucasian female who has a history of MDD, panic attacks, GERD, thyroid cancer in remission, osteoarthritis was evaluated in office today.  Patient is currently tolerating being tapered off of the Seroquel.  We will continue to taper her off.  Plan as noted below.  Plan MDD in full remission Prozac 60 mg p.o. daily Lamotrigine 150 mg p.o. twice daily Advised patient to take Seroquel XR release 50 mg every other night for the next 2 weeks and then use it as needed.  Panic attacks-stable Prozac 60 mg p.o. daily  Insomnia-stable Advised patient to stay on trazodone 200 mg p.o. nightly as needed, advised not to increase it more than 200 mg. Hydroxyzine 25 mg as needed for sleep and racing thoughts Patient also has Seroquel XR 50 mg as needed available. Continue sleep hygiene techniques.  Follow-up in clinic in 3 months or sooner if questions.  This note was generated in  part or whole with voice  recognition software. Voice recognition is usually quite accurate but there are transcription errors that can and very often do occur. I apologize for any typographical errors that were not detected and corrected.       Ursula Alert, MD 03/05/2021, 1:56 PM

## 2021-03-17 ENCOUNTER — Telehealth: Payer: Self-pay

## 2021-03-17 DIAGNOSIS — C73 Malignant neoplasm of thyroid gland: Secondary | ICD-10-CM | POA: Diagnosis not present

## 2021-03-17 DIAGNOSIS — E890 Postprocedural hypothyroidism: Secondary | ICD-10-CM | POA: Diagnosis not present

## 2021-03-17 DIAGNOSIS — N289 Disorder of kidney and ureter, unspecified: Secondary | ICD-10-CM | POA: Diagnosis not present

## 2021-03-17 NOTE — Telephone Encounter (Signed)
Spoke to patient who came into the office with sleep concerns.  Patient currently reports sleep problems are only on the nights that she does not take the Seroquel.  Patient likely not tolerating being tapered off of the Seroquel.  Advised patient to take the Seroquel every night for now.  Advised patient to have a follow-up with writer in a week from now so we can discuss further medication changes.  Patient denies any other concerns today.

## 2021-03-17 NOTE — Telephone Encounter (Signed)
pt in my office she states that she been up since saturday.

## 2021-03-21 ENCOUNTER — Telehealth: Payer: Self-pay | Admitting: Psychiatry

## 2021-03-21 NOTE — Telephone Encounter (Signed)
Attempted to contact patient to discuss her sleep problems, left voicemail.

## 2021-03-25 ENCOUNTER — Encounter: Payer: Self-pay | Admitting: Psychiatry

## 2021-03-25 ENCOUNTER — Other Ambulatory Visit: Payer: Self-pay

## 2021-03-25 ENCOUNTER — Other Ambulatory Visit: Payer: Self-pay | Admitting: Family Medicine

## 2021-03-25 ENCOUNTER — Telehealth (INDEPENDENT_AMBULATORY_CARE_PROVIDER_SITE_OTHER): Payer: Medicare Other | Admitting: Psychiatry

## 2021-03-25 DIAGNOSIS — G4701 Insomnia due to medical condition: Secondary | ICD-10-CM | POA: Diagnosis not present

## 2021-03-25 DIAGNOSIS — E2839 Other primary ovarian failure: Secondary | ICD-10-CM

## 2021-03-25 DIAGNOSIS — F410 Panic disorder [episodic paroxysmal anxiety] without agoraphobia: Secondary | ICD-10-CM | POA: Diagnosis not present

## 2021-03-25 DIAGNOSIS — F3342 Major depressive disorder, recurrent, in full remission: Secondary | ICD-10-CM | POA: Diagnosis not present

## 2021-03-25 MED ORDER — DOXEPIN HCL 10 MG PO CAPS: 10.0000 mg | ORAL_CAPSULE | Freq: Every evening | ORAL | 1 refills | Status: DC | PRN

## 2021-03-25 MED ORDER — QUETIAPINE FUMARATE 25 MG PO TABS: 25.0000 mg | ORAL_TABLET | Freq: Every day | ORAL | 0 refills | Status: DC

## 2021-03-25 NOTE — Patient Instructions (Signed)
Doxepin Capsules What is this medication? DOXEPIN (DOX e pin) treats depression and anxiety. It increases the amount of serotonin and norepinephrine in the brain, hormones that help regulate mood. It belongs to a group of medications called tricyclic antidepressants (TCAs). This medicine may be used for other purposes; ask your health care provider or pharmacist if you have questions. COMMON BRAND NAME(S): Sinequan What should I tell my care team before I take this medication? They need to know if you have any of these conditions: Bipolar disorder Difficulty passing urine Glaucoma Heart disease If you frequently drink alcohol containing drinks Liver disease Lung or breathing disease, like asthma or sleep apnea Prostate trouble Schizophrenia Seizures Suicidal thoughts, plans, or attempt; a previous suicide attempt by you or a family member An unusual or allergic reaction to doxepin, other medications, foods, dyes, or preservatives Pregnant or trying to get pregnant Breast-feeding How should I use this medication? Take this medication by mouth with a glass of water. Follow the directions on the prescription label. Take your doses at regular intervals. Do not take your medication more often than directed. Do not stop taking this medication suddenly except upon the advice of your care team. Stopping this medication too quickly may cause serious side effects or your condition may worsen. A special MedGuide will be given to you by the pharmacist with each prescription and refill. Be sure to read this information carefully each time. Talk to your care team about the use of this medication in children. While this medication may be prescribed for children as young as 12 years for selected conditions, precautions do apply. Overdosage: If you think you have taken too much of this medicine contact a poison control center or emergency room at once. NOTE: This medicine is only for you. Do not share this  medicine with others. What if I miss a dose? If you miss a dose, take it as soon as you can. If it is almost time for your next dose, take only that dose. Do not take double or extra doses. What may interact with this medication? Do not take this medication with any of the following: Arsenic trioxide Certain medications used to regulate abnormal heartbeat or to treat other heart conditions Cisapride Halofantrine Levomethadyl Linezolid MAOIs like Carbex, Eldepryl, Marplan, Nardil, and Parnate Methylene blue Other medications for mental depression Phenothiazines like perphenazine, thioridazine and chlorpromazine Pimozide Procarbazine Sparfloxacin St. John's Wort This medication may also interact with the following: Cimetidine Tolazamide Ziprasidone This list may not describe all possible interactions. Give your health care provider a list of all the medicines, herbs, non-prescription drugs, or dietary supplements you use. Also tell them if you smoke, drink alcohol, or use illegal drugs. Some items may interact with your medicine. What should I watch for while using this medication? Visit your care team for regular checks on your progress. It can take several days before you feel the full effect of this medication. If you have been taking this medication regularly for some time, do not suddenly stop taking it. You must gradually reduce the dose or you may get severe side effects. Ask your care team for advice. Even after you stop taking this medication it can still affect your body for several days. Patients and their families should watch out for new or worsening thoughts of suicide or depression. Also watch out for sudden changes in feelings such as feeling anxious, agitated, panicky, irritable, hostile, aggressive, impulsive, severely restless, overly excited and hyperactive, or not being able  to sleep. If this happens, especially at the beginning of treatment or after a change in dose,  call your care team. You may get drowsy or dizzy. Do not drive, use machinery, or do anything that needs mental alertness until you know how this medication affects you. Do not stand or sit up quickly, especially if you are an older patient. This reduces the risk of dizzy or fainting spells. Alcohol may increase dizziness and drowsiness. Avoid alcoholic drinks. Do not treat yourself for coughs, colds, or allergies without asking your care team for advice. Some ingredients can increase possible side effects. Your mouth may get dry. Chewing sugarless gum or sucking hard candy, and drinking plenty of water may help. Contact your care team if the problem does not go away or is severe. This medication may cause dry eyes and blurred vision. If you wear contact lenses you may feel some discomfort. Lubricating drops may help. See your care team if the problem does not go away or is severe. This medication can make you more sensitive to the sun. Keep out of the sun. If you cannot avoid being in the sun, wear protective clothing and use sunscreen. Do not use sun lamps or tanning beds/booths. What side effects may I notice from receiving this medication? Side effects that you should report to your care team as soon as possible: Allergic reactions--skin rash, itching, hives, swelling of the face, lips, tongue, or throat Irritability, confusion, fast or irregular heartbeat, muscle stiffness, twitching muscles, sweating, high fever, seizure, chills, vomiting, diarrhea, which may be signs of serotonin syndrome Sudden eye pain or change in vision such as blurry vision, seeing halos around lights, vision loss Thoughts of suicide or self-harm, worsening mood, or feelings of depression Trouble passing urine Side effects that usually do not require medical attention (report to your care team if they continue or are bothersome): Change in sex drive or performance Constipation Dizziness Drowsiness Dry  mouth Tremors Weight gain This list may not describe all possible side effects. Call your doctor for medical advice about side effects. You may report side effects to FDA at 1-800-FDA-1088. Where should I keep my medication? Keep out of the reach of children. Store at room temperature between 15 and 30 degrees C (59 and 86 degrees F). Throw away any unused medication after the expiration date. NOTE: This sheet is a summary. It may not cover all possible information. If you have questions about this medicine, talk to your doctor, pharmacist, or health care provider.  2022 Elsevier/Gold Standard (2020-04-25 00:00:00)

## 2021-03-25 NOTE — Progress Notes (Signed)
Virtual Visit via Telephone Note  I connected with Jamie Baxter on 03/25/21 at  3:00 PM EST by telephone and verified that I am speaking with the correct person using two identifiers.  Location Provider Location : [facility] Patient Location : Home  Participants: Patient , Provider   I discussed the limitations, risks, security and privacy concerns of performing an evaluation and management service by telephone and the availability of in person appointments. I also discussed with the patient that there may be a patient responsible charge related to this service. The patient expressed understanding and agreed to proceed.   I discussed the assessment and treatment plan with the patient. The patient was provided an opportunity to ask questions and all were answered. The patient agreed with the plan and demonstrated an understanding of the instructions.   The patient was advised to call back or seek an in-person evaluation if the symptoms worsen or if the condition fails to improve as anticipated.   [facility] MD OP Progress Note  03/25/2021 4:50 PM Jamie Baxter  MRN:  [id number]  Chief Complaint:  Chief Complaint  Patient presents with   Follow-up; 71 year old Caucasian female with history of MDD, panic disorder, insomnia was evaluated for medication management.   HPI: Jamie Baxter is a 71 year old Caucasian female, married, retired, lives in [place], has a history of MDD, panic disorder, osteoarthritis, history of thyroid cancer, GERD was evaluated by phone today.  Patient today reports she is currently back on the Seroquel extended release 50 mg every night.  Since doing so she has been sleeping well.  Patient however is interested in being tapered off of the Seroquel due to long-term adverse side effects.  When she does not take the Seroquel the trazodone by itself does not help her with her sleep.  She is currently on trazodone 200 mg at bedtime.  Patient is interested in trial of another sleep  medication.  Agreeable to a trial of doxepin, has never tried it in the past.  Denies any significant depression, anxiety symptoms.  Reports appetite is fair.  Denies any suicidality, homicidality or perceptual disturbances.  Denies any other concerns today.  Visit Diagnosis:    ICD-10-CM   1. MDD (major depressive disorder), recurrent, in full remission ([place])  F33.42 QUEtiapine (SEROQUEL) 25 MG tablet    2. Panic disorder  F41.0 QUEtiapine (SEROQUEL) 25 MG tablet    3. Insomnia due to medical condition  G47.01 QUEtiapine (SEROQUEL) 25 MG tablet    doxepin (SINEQUAN) 10 MG capsule   Mood, pain      Past Psychiatric History: Reviewed past psychiatric history from progress note on 08/11/2018.  Past trials of Pristiq, Prozac, Lamictal, Seroquel.  Past Medical History:  Past Medical History:  Diagnosis Date   Anxiety    Depression    GERD (gastroesophageal reflux disease)    Thyroid disease     Past Surgical History:  Procedure Laterality Date   ABSCESS DRAINAGE     BREAST BIOPSY     BREAST SURGERY     breast biopsy   COLONOSCOPY  2006, 07/17/10   Normal (except melanosis), ? hx polyps in 1990's - [place]   rotator cuff     THYROID LOBECTOMY  1978   goiter   THYROIDECTOMY SUBSTERNAL  2010   left side/cancer/radioactive iodine 2 years in a row for Ca cells   VAGINAL HYSTERECTOMY     fiboid tumors    Family Psychiatric History: Reviewed family psychiatric history from progress  note on 08/11/2018.  Family History:  Family History  Problem Relation Age of Onset   GER disease Father    Rheum arthritis Sister    Healthy Sister    Hypertension Mother    Mental illness Neg Hx     Social History: Reviewed social history from progress note on 08/11/2018. Social History   Socioeconomic History   Marital status: Married    Spouse name: monroe   Number of children: 1   Years of education: Not on file   Highest education level: High school graduate  Occupational History    Not on file  Tobacco Use   Smoking status: Former    Types: Cigarettes    Quit date: 02/03/1976    Years since quitting: 45.1   Smokeless tobacco: Never  Vaping Use   Vaping Use: Never used  Substance and Sexual Activity   Alcohol use: No   Drug use: No   Sexual activity: Not Currently  Other Topics Concern   Not on file  Social History Narrative   Not on file   Social Determinants of Health   Financial Resource Strain: Not on file  Food Insecurity: Not on file  Transportation Needs: Not on file  Physical Activity: Not on file  Stress: Not on file  Social Connections: Not on file    Allergies:  Allergies  Allergen Reactions   Amantadine Other (See Comments)    disorientation   Benztropine    Geodon  [Ziprasidone Hcl] Other (See Comments)    disorientation   Sulfa Antibiotics    Z-Pak [Azithromycin]     hallucinations    Metabolic Disorder Labs: Lab Results  Component Value Date   HGBA1C 5.7 (H) 09/19/2020   MPG 116.89 09/19/2020   Lab Results  Component Value Date   PROLACTIN 15.8 09/19/2020   Lab Results  Component Value Date   CHOL 235 (H) 11/28/2020   TRIG 94 11/28/2020   HDL 63 11/28/2020   CHOLHDL 3.7 11/28/2020   VLDL 19 11/28/2020   LDLCALC 153 (H) 11/28/2020   No results found for: TSH  Therapeutic Level Labs: No results found for: LITHIUM No results found for: VALPROATE No components found for:  CBMZ  Current Medications: Current Outpatient Medications  Medication Sig Dispense Refill   doxepin (SINEQUAN) 10 MG capsule Take 1-2 capsules (10-20 mg total) by mouth at bedtime as needed. For sleep 30 capsule 1   QUEtiapine (SEROQUEL) 25 MG tablet Take 1 tablet (25 mg total) by mouth at bedtime. Reduced dose 30 tablet 0   acetaminophen (TYLENOL) 325 MG tablet Take by mouth.     B Complex Vitamins (B COMPLEX PO) Take by mouth.     Calcium Carbonate-Vitamin D 600-400 MG-UNIT tablet Take by mouth.     celecoxib (CELEBREX) 100 MG capsule Take  100 mg by mouth 2 (two) times daily.     Coenzyme Q10 (COQ10 PO) Take by mouth.     FLUoxetine (PROZAC) 20 MG capsule TAKE 1 CAPSULE BY MOUTH  DAILY TO BE COMBINED WITH  40 MG 90 capsule 3   FLUoxetine (PROZAC) 40 MG capsule TAKE 1 CAPSULE BY MOUTH  DAILY TO BE COMBINED WITH  20 MG (TOTAL 60MG  DAILY) 90 capsule 3   fluticasone (FLONASE) 50 MCG/ACT nasal spray   1   GLUCOSAMINE-CHONDROITIN PO Take by mouth.     hydrOXYzine (VISTARIL) 25 MG capsule TAKE 1 CAP BY MOUTH DAILY AS NEEDED TO BE TAKE 1-3 TIMES A WEEK AS NEEDED  FOR SEVERE PANIC ATTACKS 90 capsule 1   lamoTRIgine (LAMICTAL) 150 MG tablet TAKE 1 TABLET BY MOUTH  TWICE DAILY 180 tablet 3   MILK THISTLE PO Take by mouth.     MODERNA COVID-19 BIVAL BOOSTER 50 MCG/0.5ML injection      Multiple Vitamin (MULTI-VITAMIN) tablet Take 1 tablet by mouth daily.     omeprazole (PRILOSEC) 40 MG capsule TAKE 1 CAPSULE BY MOUTH EVERY DAY 90 capsule 0   SM CALCIUM 600/VITAMIN D 600-400 MG-UNIT tablet Take 1 tablet by mouth 2 (two) times daily.     SYNTHROID 150 MCG tablet Take 150 mcg by mouth daily.     triamcinolone cream (KENALOG) 0.1 % TRIAMCINOLONE ACETONIDE, 0.1% (External Cream)  1 (one) Cream Cream use as directed for 0 days  Quantity: 30;  Refills: 1   Ordered :12-Jan-2014  Meyer Cory ;  Started 12-Jan-2014 Active     Current Facility-Administered Medications  Medication Dose Route Frequency Provider Last Rate Last Admin   0.9 %  sodium chloride infusion  500 mL Intravenous Continuous Gatha Mayer, MD         Musculoskeletal: Strength & Muscle Tone:  [place]:  UTA Patient leans: N/A  Psychiatric Specialty Exam: Review of Systems  Psychiatric/Behavioral:  Positive for sleep disturbance.   All other systems reviewed and are negative.  There were no vitals taken for this visit.There is no height or weight on file to calculate BMI.  General Appearance:  UTA  Eye Contact:   UTA  Speech:  Clear and Coherent   Volume:  Normal  Mood:   euthymic  Affect:   UTA  Thought Process:  Goal Directed and Descriptions of Associations: Intact  Orientation:  Full (Time, Place, and Person)  Thought Content: Logical   Suicidal Thoughts:  No  Homicidal Thoughts:  No  Memory:  Immediate;   Fair Recent;   Fair Remote;   Fair  Judgement:  Fair  Insight:  Fair  Psychomotor Activity:  Normal  Concentration:  Concentration: Fair and Attention Span: Fair  Recall:  [employer] of Knowledge: Fair  Language: Fair  Akathisia:  No  Handed:  Right  AIMS (if indicated): not done  Assets:  Communication Skills Desire for Improvement Housing Social Support  ADL's:  Intact  Cognition: WNL  Sleep:  Poor when she does not take seroquel   Screenings: [place] Office Visit from 11/26/2020 in [place] Office Visit from 08/20/2020 in [place] Total Score 0 0      [place] Visit from 01/15/2021 in [place] Office Visit from 08/20/2020 in [place]  Total GAD-7 Score 0 0      PHQ2-9    [place] Visit from 03/05/2021 in [place] Visit from 01/15/2021 in [place] Visit from 11/26/2020 in [place] Visit from 08/20/2020 in [place] Video Visit from 03/18/2020 in [place]  PHQ-2 Total Score 0 0 0 0 0  PHQ-9 Total Score -- -- -- 2 --      [place] Visit from 08/20/2020 in [place] Video Visit from 03/18/2020 in [place] No Risk No Risk        Assessment and Plan: ARAIYAH CUMPTON is a 71 year old  Caucasian female who has a history of MDD, panic attacks, GERD, thyroid  cancer in remission, osteoarthritis was evaluated by phone today.  Patient is interested in being tapered off of the Seroquel however had insomnia when she tried it, discussed plan as noted below.  Plan MDD in full remission Prozac 60 mg p.o. daily Lamotrigine 150 mg p.o. twice daily Reduce Seroquel to 25 mg p.o. nightly every night.   Insomnia-unstable Start doxepin 10 to 20 mg p.o. nightly as needed for sleep Discontinue trazodone. Provided medication education including drug to drug interaction with Prozac, Seroquel.   Panic attacks-stable Prozac 60 mg p.o. daily Hydroxyzine 25 mg as needed for sleep as well as anxiety symptoms.  Follow-up in clinic in 10 days or sooner if needed.    Consent: Patient/Guardian gives verbal consent for treatment and assignment of benefits for services provided during this visit. Patient/Guardian expressed understanding and agreed to proceed.    I have spent at least 20 minutes non face to face with patient today.   This note was generated in part or whole with voice recognition software. Voice recognition is usually quite accurate but there are transcription errors that can and very often do occur. I apologize for any typographical errors that were not detected and corrected.      Ursula Alert, MD 03/26/2021, 8:44 AM

## 2021-03-26 ENCOUNTER — Other Ambulatory Visit: Payer: Self-pay | Admitting: Psychiatry

## 2021-03-26 DIAGNOSIS — F3342 Major depressive disorder, recurrent, in full remission: Secondary | ICD-10-CM

## 2021-03-26 DIAGNOSIS — G4701 Insomnia due to medical condition: Secondary | ICD-10-CM

## 2021-03-26 DIAGNOSIS — F410 Panic disorder [episodic paroxysmal anxiety] without agoraphobia: Secondary | ICD-10-CM

## 2021-03-31 DIAGNOSIS — E7800 Pure hypercholesterolemia, unspecified: Secondary | ICD-10-CM | POA: Diagnosis not present

## 2021-03-31 DIAGNOSIS — C73 Malignant neoplasm of thyroid gland: Secondary | ICD-10-CM | POA: Diagnosis not present

## 2021-03-31 DIAGNOSIS — E890 Postprocedural hypothyroidism: Secondary | ICD-10-CM | POA: Diagnosis not present

## 2021-03-31 DIAGNOSIS — K219 Gastro-esophageal reflux disease without esophagitis: Secondary | ICD-10-CM | POA: Diagnosis not present

## 2021-03-31 DIAGNOSIS — N289 Disorder of kidney and ureter, unspecified: Secondary | ICD-10-CM | POA: Diagnosis not present

## 2021-04-01 ENCOUNTER — Other Ambulatory Visit: Payer: Self-pay | Admitting: Psychiatry

## 2021-04-01 DIAGNOSIS — G4701 Insomnia due to medical condition: Secondary | ICD-10-CM

## 2021-04-04 ENCOUNTER — Other Ambulatory Visit: Payer: Self-pay

## 2021-04-04 ENCOUNTER — Telehealth (INDEPENDENT_AMBULATORY_CARE_PROVIDER_SITE_OTHER): Payer: Medicare Other | Admitting: Psychiatry

## 2021-04-04 ENCOUNTER — Encounter: Payer: Self-pay | Admitting: Psychiatry

## 2021-04-04 DIAGNOSIS — F3342 Major depressive disorder, recurrent, in full remission: Secondary | ICD-10-CM | POA: Diagnosis not present

## 2021-04-04 DIAGNOSIS — F410 Panic disorder [episodic paroxysmal anxiety] without agoraphobia: Secondary | ICD-10-CM | POA: Diagnosis not present

## 2021-04-04 DIAGNOSIS — G4701 Insomnia due to medical condition: Secondary | ICD-10-CM

## 2021-04-04 MED ORDER — TRAZODONE HCL 100 MG PO TABS: 200.0000 mg | ORAL_TABLET | Freq: Every evening | ORAL | 0 refills | Status: DC | PRN

## 2021-04-04 NOTE — Progress Notes (Signed)
Virtual Visit via Telephone Note  I connected with Jamie Baxter on 04/04/21 at 11:00 AM EST by telephone and verified that I am speaking with the correct person using two identifiers.  Location Provider Location : [facility] Patient Location : Home  Participants: Patient , Provider   I discussed the limitations, risks, security and privacy concerns of performing an evaluation and management service by telephone and the availability of in person appointments. I also discussed with the patient that there may be a patient responsible charge related to this service. The patient expressed understanding and agreed to proceed.   I discussed the assessment and treatment plan with the patient. The patient was provided an opportunity to ask questions and all were answered. The patient agreed with the plan and demonstrated an understanding of the instructions.   The patient was advised to call back or seek an in-person evaluation if the symptoms worsen or if the condition fails to improve as anticipated.    [place] MD OP Progress Note  04/04/2021 12:27 PM Jamie Baxter  MRN:  [id number]  Chief Complaint:  Chief Complaint  Patient presents with   Follow-up: 71 year old Caucasian female with history of MDD, panic disorder, insomnia was evaluated for medication management.   HPI: Jamie Baxter is a 71 year old Caucasian female, married, retired, lives in [place], has a history of MDD, panic disorder, osteoarthritis, history of thyroid cancer, GERD was evaluated by phone today.  Patient today reports the last 1 week she has been sleeping better.  She however did not start the doxepin.  She reports she was worried about starting the doxepin because of all the side effects that were listed.  Patient reports she hence stayed on the Seroquel, started taking half tablet of the Seroquel as well as trazodone 200 mg together.  She also reports she has been working on her sleep hygiene techniques, having her dinner  early, exercising regularly, switching off electronic devices prior to bedtime, working on IT trainer, listening to [employer].  That has been beneficial.  Patient reports otherwise mood symptoms are currently stable.  Denies any significant anxiety or depression.  Patient denies any suicidality, homicidality or perceptual disturbances.  Patient denies any other concerns today.  Visit Diagnosis:    ICD-10-CM   1. MDD (major depressive disorder), recurrent, in full remission ([place])  F33.42     2. Panic disorder  F41.0     3. Insomnia due to medical condition  G47.01    mood      Past Psychiatric History: Reviewed past psychiatric history from progress note on 08/11/2018.  Past Medical History:  Past Medical History:  Diagnosis Date   Anxiety    Depression    GERD (gastroesophageal reflux disease)    Thyroid disease     Past Surgical History:  Procedure Laterality Date   ABSCESS DRAINAGE     BREAST BIOPSY     BREAST SURGERY     breast biopsy   COLONOSCOPY  2006, 07/17/10   Normal (except melanosis), ? hx polyps in 1990's - [place]   rotator cuff     THYROID LOBECTOMY  1978   goiter   THYROIDECTOMY SUBSTERNAL  2010   left side/cancer/radioactive iodine 2 years in a row for Ca cells   VAGINAL HYSTERECTOMY     fiboid tumors    Family Psychiatric History: Reviewed past psychiatric history from progress note on 08/11/2018.  Family History:  Family History  Problem Relation Age of Onset   GER  disease Father    Rheum arthritis Sister    Healthy Sister    Hypertension Mother    Mental illness Neg Hx     Social History: Reviewed social history from progress note on 08/11/2018. Social History   Socioeconomic History   Marital status: Married    Spouse name: monroe   Number of children: 1   Years of education: Not on file   Highest education level: High school graduate  Occupational History   Not on file  Tobacco Use   Smoking status: Former     Types: Cigarettes    Quit date: 02/03/1976    Years since quitting: 45.1   Smokeless tobacco: Never  Vaping Use   Vaping Use: Never used  Substance and Sexual Activity   Alcohol use: No   Drug use: No   Sexual activity: Not Currently  Other Topics Concern   Not on file  Social History Narrative   Not on file   Social Determinants of Health   Financial Resource Strain: Not on file  Food Insecurity: Not on file  Transportation Needs: Not on file  Physical Activity: Not on file  Stress: Not on file  Social Connections: Not on file    Allergies:  Allergies  Allergen Reactions   Amantadine Other (See Comments)    disorientation   Benztropine    Geodon  [Ziprasidone Hcl] Other (See Comments)    disorientation   Sulfa Antibiotics    Z-Pak [Azithromycin]     hallucinations    Metabolic Disorder Labs: Lab Results  Component Value Date   HGBA1C 5.7 (H) 09/19/2020   MPG 116.89 09/19/2020   Lab Results  Component Value Date   PROLACTIN 15.8 09/19/2020   Lab Results  Component Value Date   CHOL 235 (H) 11/28/2020   TRIG 94 11/28/2020   HDL 63 11/28/2020   CHOLHDL 3.7 11/28/2020   VLDL 19 11/28/2020   LDLCALC 153 (H) 11/28/2020   No results found for: TSH  Therapeutic Level Labs: No results found for: LITHIUM No results found for: VALPROATE No components found for:  CBMZ  Current Medications: Current Outpatient Medications  Medication Sig Dispense Refill   traZODone (DESYREL) 100 MG tablet Take 2 tablets (200 mg total) by mouth at bedtime as needed for sleep. 180 tablet 0   acetaminophen (TYLENOL) 325 MG tablet Take by mouth.     B Complex Vitamins (B COMPLEX PO) Take by mouth.     Calcium Carbonate-Vitamin D 600-400 MG-UNIT tablet Take by mouth.     celecoxib (CELEBREX) 100 MG capsule Take 100 mg by mouth 2 (two) times daily.     Coenzyme Q10 (COQ10 PO) Take by mouth.     FLUoxetine (PROZAC) 20 MG capsule TAKE 1 CAPSULE BY MOUTH  DAILY TO BE COMBINED WITH  40  MG 90 capsule 3   FLUoxetine (PROZAC) 40 MG capsule TAKE 1 CAPSULE BY MOUTH  DAILY TO BE COMBINED WITH  20 MG (TOTAL 60MG  DAILY) 90 capsule 3   fluticasone (FLONASE) 50 MCG/ACT nasal spray   1   GLUCOSAMINE-CHONDROITIN PO Take by mouth.     hydrOXYzine (VISTARIL) 25 MG capsule TAKE 1 CAP BY MOUTH DAILY AS NEEDED TO BE TAKE 1-3 TIMES A WEEK AS NEEDED FOR SEVERE PANIC ATTACKS 90 capsule 1   lamoTRIgine (LAMICTAL) 150 MG tablet TAKE 1 TABLET BY MOUTH  TWICE DAILY 180 tablet 3   MILK THISTLE PO Take by mouth.     MODERNA COVID-19 BIVAL  BOOSTER 50 MCG/0.5ML injection      Multiple Vitamin (MULTI-VITAMIN) tablet Take 1 tablet by mouth daily.     omeprazole (PRILOSEC) 40 MG capsule TAKE 1 CAPSULE BY MOUTH EVERY DAY 90 capsule 0   QUEtiapine (SEROQUEL) 25 MG tablet TAKE 1 TABLET (25 MG TOTAL) BY MOUTH AT BEDTIME. REDUCED DOSE 90 tablet 0   SM CALCIUM 600/VITAMIN D 600-400 MG-UNIT tablet Take 1 tablet by mouth 2 (two) times daily.     SYNTHROID 150 MCG tablet Take 150 mcg by mouth daily.     triamcinolone cream (KENALOG) 0.1 % TRIAMCINOLONE ACETONIDE, 0.1% (External Cream)  1 (one) Cream Cream use as directed for 0 days  Quantity: 30;  Refills: 1   Ordered :12-Jan-2014  Meyer Cory ;  Started 12-Jan-2014 Active     Current Facility-Administered Medications  Medication Dose Route Frequency Provider Last Rate Last Admin   0.9 %  sodium chloride infusion  500 mL Intravenous Continuous Gatha Mayer, MD         Musculoskeletal: Strength & Muscle Tone:  [place]:  UTA Patient leans: N/A  Psychiatric Specialty Exam: Review of Systems  Psychiatric/Behavioral:  Positive for sleep disturbance (improving).   All other systems reviewed and are negative.  There were no vitals taken for this visit.There is no height or weight on file to calculate BMI.  General Appearance:  UTA  Eye Contact:   UTA  Speech:  Clear and Coherent  Volume:  Normal  Mood:  Euthymic  Affect:  Congruent   Thought Process:  Goal Directed and Descriptions of Associations: Intact  Orientation:  Full (Time, Place, and Person)  Thought Content: Logical   Suicidal Thoughts:  No  Homicidal Thoughts:  No  Memory:  Immediate;   Fair Recent;   Fair Remote;   Fair  Judgement:  Fair  Insight:  Fair  Psychomotor Activity:   UTA  Concentration:  Concentration: Fair and Attention Span: Fair  Recall:  [employer] of Knowledge: Fair  Language: Fair  Akathisia:  No  Handed:  Right  AIMS (if indicated): not done  Assets:  Communication Skills Desire for Improvement Social Support Transportation  ADL's:  Intact  Cognition: WNL  Sleep:   Improving   Screenings: [place] Office Visit from 11/26/2020 in [place] [facility] Office Visit from 08/20/2020 in [facility] Total Score 0 0      GAD-7    [place] Visit from 01/15/2021 in [place] [place] Office Visit from 08/20/2020 in [facility]  Total GAD-7 Score 0 0      PHQ2-9    [place] Visit from 03/05/2021 in [facility] from 01/15/2021 in [facility] Visit from 11/26/2020 in [facility] Visit from 08/20/2020 in [facility] Video Visit from 03/18/2020 in [facility]  PHQ-2 Total Score 0 0 0 0 0  PHQ-9 Total Score -- -- -- 2 --      [place] Visit from 08/20/2020 in [facility] Video Visit from 03/18/2020 in [facility] [facility] [facility] No Risk No Risk        Assessment and Plan: YAZLYN Baxter is a 71 year old Caucasian female who has a history of MDD, panic attacks, GERD, thyroid cancer in remission, osteoarthritis was evaluated by phone today.  Patient  noncompliant on doxepin.  Patient however is currently improving on the current medication regimen, plan as noted below.  Plan MDD in full remission Prozac 60 mg p.o. daily Lamotrigine 150 mg p.o. twice daily Seroquel 25 mg p.o. nightly-reduced dosage.  Patient currently has supplies and reports she will let writer know if she needs supplies.  Insomnia-improving Continue sleep hygiene techniques Discontinue doxepin for noncompliance Continue trazodone 100-200 mg p.o. nightly Continue Seroquel at 25 mg at bedtime  Panic disorder-stable Prozac 60 mg p.o. daily Hydroxyzine 25 mg as needed for severe anxiety attacks  Follow-up in clinic in 6 to 7 weeks or sooner if needed.   I have spent at least 16 minutes non face to face with patient today .     Consent: Patient/Guardian gives verbal consent for treatment and assignment of benefits for services provided during this visit. Patient/Guardian expressed understanding and agreed to proceed.  This note was generated in part or whole with voice recognition software. Voice recognition is usually quite accurate but there are transcription errors that can and very often do occur. I apologize for any typographical errors that were not detected and corrected.       Ursula Alert, MD 04/04/2021, 12:27 PM

## 2021-04-18 DIAGNOSIS — H43813 Vitreous degeneration, bilateral: Secondary | ICD-10-CM | POA: Diagnosis not present

## 2021-05-13 ENCOUNTER — Ambulatory Visit
Admission: RE | Admit: 2021-05-13 | Discharge: 2021-05-13 | Disposition: A | Discharge status: Home / Self Care | Payer: Medicare Other | Source: Ambulatory Visit | Attending: Family Medicine | Admitting: Family Medicine

## 2021-05-13 DIAGNOSIS — E2839 Other primary ovarian failure: Secondary | ICD-10-CM | POA: Insufficient documentation

## 2021-05-13 DIAGNOSIS — Z780 Asymptomatic menopausal state: Secondary | ICD-10-CM | POA: Diagnosis not present

## 2021-05-13 DIAGNOSIS — M8589 Other specified disorders of bone density and structure, multiple sites: Secondary | ICD-10-CM | POA: Diagnosis not present

## 2021-05-23 ENCOUNTER — Encounter: Payer: Self-pay | Admitting: Psychiatry

## 2021-05-23 ENCOUNTER — Ambulatory Visit (INDEPENDENT_AMBULATORY_CARE_PROVIDER_SITE_OTHER): Payer: Medicare Other | Admitting: Psychiatry

## 2021-05-23 VITALS — BP 133/85 | HR 81 | Temp 98.1°F | Wt 200.8 lb

## 2021-05-23 DIAGNOSIS — G4701 Insomnia due to medical condition: Secondary | ICD-10-CM | POA: Diagnosis not present

## 2021-05-23 DIAGNOSIS — F410 Panic disorder [episodic paroxysmal anxiety] without agoraphobia: Secondary | ICD-10-CM | POA: Diagnosis not present

## 2021-05-23 DIAGNOSIS — F3342 Major depressive disorder, recurrent, in full remission: Secondary | ICD-10-CM | POA: Diagnosis not present

## 2021-05-23 NOTE — Progress Notes (Signed)
[facility] MD OP Progress Note ? ?05/23/2021 11:30 AM ?Jamie Baxter  ?MRN:  [id number] ? ?Chief Complaint:  ?Chief Complaint  ?Patient presents with  ? Follow-up: 71 year old Caucasian female with history of depression, panic attacks, insomnia was evaluated for medication management.  ? ?HPI: Jamie Baxter is a 71 year old Caucasian female, married, retired, lives in [place], has a history of MDD, panic disorder, osteoarthritis, history of thyroid cancer, GERD was evaluated in office today. ? ?Patient reports she is currently doing well with regards to her mood.  Denies any sadness, anhedonia, lack of motivation.  Denies any significant anxiety symptoms. ? ?Reports sleep as improving.  Currently takes Seroquel 25 mg 5 times a week.  She is planning to cut back to 4 times a week however has not done it yet.  The nights that she does not take the Seroquel she uses a high dosage of trazodone.  Does have sleep problems when she cannot sleep at all at night once a month or so.  Otherwise sleep has been better.  Continues to work on sleep hygiene techniques. ? ?Patient reports she currently helps take care of her sister who is currently struggling with medical problems.  She has been coping okay with that. ? ?Reports she has started exercising, going out for walks.  That has helped with her mood and her sleep as well. ? ?Denies any suicidality, homicidality or perceptual disturbances. ? ?Denies any other concerns today. ? ?Visit Diagnosis:  ?  ICD-10-CM   ?1. MDD (major depressive disorder), recurrent, in full remission ([place])  F33.42   ?  ?2. Panic disorder  F41.0   ?  ?3. Insomnia due to medical condition  G47.01   ? mood  ?  ? ? ?Past Psychiatric History: Reviewed past psychiatric history from progress note on 08/11/2018. ? ?Past Medical History:  ?Past Medical History:  ?Diagnosis Date  ? Anxiety   ? Depression   ? GERD (gastroesophageal reflux disease)   ? Thyroid disease   ?  ?Past Surgical History:  ?Procedure Laterality Date   ? ABSCESS DRAINAGE    ? BREAST BIOPSY    ? BREAST SURGERY    ? breast biopsy  ? COLONOSCOPY  2006, 07/17/10  ? Normal (except melanosis), ? hx polyps in 1990's - [place]  ? rotator cuff    ? THYROID LOBECTOMY  1978  ? goiter  ? THYROIDECTOMY SUBSTERNAL  2010  ? left side/cancer/radioactive iodine 2 years in a row for Ca cells  ? VAGINAL HYSTERECTOMY    ? fiboid tumors  ? ? ?Family Psychiatric History: Reviewed family psychiatric history from progress note on 08/11/2018. ? ?Family History:  ?Family History  ?Problem Relation Age of Onset  ? GER disease Father   ? Rheum arthritis Sister   ? Healthy Sister   ? Hypertension Mother   ? Mental illness Neg Hx   ? ? ?Social History: Reviewed social history from progress note on 08/11/2018. ?Social History  ? ?Socioeconomic History  ? Marital status: Married  ?  Spouse name: monroe  ? Number of children: 1  ? Years of education: Not on file  ? Highest education level: High school graduate  ?Occupational History  ? Not on file  ?Tobacco Use  ? Smoking status: Former  ?  Types: Cigarettes  ?  Quit date: 02/03/1976  ?  Years since quitting: 45.3  ? Smokeless tobacco: Never  ?Vaping Use  ? Vaping Use: Never used  ?Substance and Sexual Activity  ?  Alcohol use: No  ? Drug use: No  ? Sexual activity: Not Currently  ?Other Topics Concern  ? Not on file  ?Social History Narrative  ? Not on file  ? ?Social Determinants of Health  ? ?Financial Resource Strain: Not on file  ?Food Insecurity: Not on file  ?Transportation Needs: Not on file  ?Physical Activity: Not on file  ?Stress: Not on file  ?Social Connections: Not on file  ? ? ?Allergies:  ?Allergies  ?Allergen Reactions  ? Amantadine Other (See Comments)  ?  disorientation  ? Benztropine   ? Geodon  [Ziprasidone Hcl] Other (See Comments)  ?  disorientation  ? Sulfa Antibiotics   ? Z-Pak [Azithromycin]   ?  hallucinations  ? ? ?Metabolic Disorder Labs: ?Lab Results  ?Component Value Date  ? HGBA1C 5.7 (H) 09/19/2020  ? MPG 116.89  09/19/2020  ? ?Lab Results  ?Component Value Date  ? PROLACTIN 15.8 09/19/2020  ? ?Lab Results  ?Component Value Date  ? CHOL 235 (H) 11/28/2020  ? TRIG 94 11/28/2020  ? HDL 63 11/28/2020  ? CHOLHDL 3.7 11/28/2020  ? VLDL 19 11/28/2020  ? LDLCALC 153 (H) 11/28/2020  ? ?No results found for: TSH ? ?Therapeutic Level Labs: ?No results found for: LITHIUM ?No results found for: VALPROATE ?No components found for:  CBMZ ? ?Current Medications: ?Current Outpatient Medications  ?Medication Sig Dispense Refill  ? acetaminophen (TYLENOL) 325 MG tablet Take by mouth.    ? B Complex Vitamins (B COMPLEX PO) Take by mouth.    ? Calcium Carbonate-Vitamin D 600-400 MG-UNIT tablet Take by mouth.    ? celecoxib (CELEBREX) 100 MG capsule Take 100 mg by mouth 2 (two) times daily.    ? Coenzyme Q10 (COQ10 PO) Take by mouth.    ? FLUoxetine (PROZAC) 20 MG capsule TAKE 1 CAPSULE BY MOUTH  DAILY TO BE COMBINED WITH  40 MG 90 capsule 3  ? FLUoxetine (PROZAC) 40 MG capsule TAKE 1 CAPSULE BY MOUTH  DAILY TO BE COMBINED WITH  20 MG (TOTAL '60MG'$  DAILY) 90 capsule 3  ? fluticasone (FLONASE) 50 MCG/ACT nasal spray   1  ? GLUCOSAMINE-CHONDROITIN PO Take by mouth.    ? hydrOXYzine (VISTARIL) 25 MG capsule TAKE 1 CAP BY MOUTH DAILY AS NEEDED TO BE TAKE 1-3 TIMES A WEEK AS NEEDED FOR SEVERE PANIC ATTACKS 90 capsule 1  ? lamoTRIgine (LAMICTAL) 150 MG tablet TAKE 1 TABLET BY MOUTH  TWICE DAILY 180 tablet 3  ? MILK THISTLE PO Take by mouth.    ? MODERNA COVID-19 BIVAL BOOSTER 50 MCG/0.5ML injection     ? Multiple Vitamin (MULTI-VITAMIN) tablet Take 1 tablet by mouth daily.    ? omeprazole (PRILOSEC) 40 MG capsule TAKE 1 CAPSULE BY MOUTH EVERY DAY 90 capsule 0  ? QUEtiapine (SEROQUEL) 25 MG tablet TAKE 1 TABLET (25 MG TOTAL) BY MOUTH AT BEDTIME. REDUCED DOSE 90 tablet 0  ? SM CALCIUM 600/VITAMIN D 600-400 MG-UNIT tablet Take 1 tablet by mouth 2 (two) times daily.    ? SYNTHROID 150 MCG tablet Take 150 mcg by mouth daily.    ? traZODone (DESYREL) 100 MG  tablet Take 2 tablets (200 mg total) by mouth at bedtime as needed for sleep. 180 tablet 0  ? triamcinolone cream (KENALOG) 0.1 % TRIAMCINOLONE ACETONIDE, 0.1% (External Cream) ? 1 (one) Cream Cream use as directed for 0 days ? Quantity: 30;  Refills: 1 ? ? Ordered :12-Jan-2014 ? Josiah Lobo, [place] ;  Started  12-Jan-2014 ?Active    ? ?Current Facility-Administered Medications  ?Medication Dose Route Frequency Provider Last Rate Last Admin  ? 0.9 %  sodium chloride infusion  500 mL Intravenous Continuous Gatha Mayer, MD      ? ? ? ?Musculoskeletal: ?Strength & Muscle Tone: within normal limits ?Gait & Station: normal ?Patient leans: N/A ? ?Psychiatric Specialty Exam: ?Review of Systems  ?Psychiatric/Behavioral:  Positive for sleep disturbance. Negative for agitation, behavioral problems, confusion, decreased concentration, dysphoric mood, hallucinations, self-injury and suicidal ideas. The patient is not nervous/anxious and is not hyperactive.   ?All other systems reviewed and are negative.  ?Blood pressure 133/85, pulse 81, temperature 98.1 ?F (36.7 ?C), temperature source Temporal, weight 200 lb 12.8 oz (91.1 kg).Body mass index is 31.15 kg/m?.  ?General Appearance: Casual  ?Eye Contact:  Fair  ?Speech:  Clear and Coherent  ?Volume:  Normal  ?Mood:  Euthymic  ?Affect:  Congruent  ?Thought Process:  Goal Directed and Descriptions of Associations: Intact  ?Orientation:  Full (Time, Place, and Person)  ?Thought Content: Logical   ?Suicidal Thoughts:  No  ?Homicidal Thoughts:  No  ?Memory:  Immediate;   Fair ?Recent;   Fair ?Remote;   Fair  ?Judgement:  Fair  ?Insight:  Fair  ?Psychomotor Activity:  Normal  ?Concentration:  Concentration: Fair and Attention Span: Fair  ?Recall:  Fair  ?Fund of Knowledge: Fair  ?Language: Fair  ?Akathisia:  No  ?Handed:  Right  ?AIMS (if indicated): done  ?Assets:  Communication Skills ?Desire for Improvement ?Housing ?Social Support  ?ADL's:  Intact  ?Cognition: WNL  ?Sleep:    improving  ? ?Screenings: ?AIMS   ? ?[place] Office Visit from 05/23/2021 in [facility] Office Visit from 11/26/2020 in [facility] Office Visit from

## 2021-06-11 ENCOUNTER — Ambulatory Visit: Payer: Medicare Other | Admitting: Psychiatry

## 2021-07-25 DIAGNOSIS — H0012 Chalazion right lower eyelid: Secondary | ICD-10-CM | POA: Diagnosis not present

## 2021-08-12 ENCOUNTER — Other Ambulatory Visit: Payer: Self-pay | Admitting: Psychiatry

## 2021-08-14 ENCOUNTER — Ambulatory Visit (INDEPENDENT_AMBULATORY_CARE_PROVIDER_SITE_OTHER): Payer: Medicare Other | Admitting: Psychiatry

## 2021-08-14 ENCOUNTER — Encounter: Payer: Self-pay | Admitting: Psychiatry

## 2021-08-14 VITALS — BP 136/79 | HR 78 | Temp 98.5°F | Wt 207.2 lb

## 2021-08-14 DIAGNOSIS — F410 Panic disorder [episodic paroxysmal anxiety] without agoraphobia: Secondary | ICD-10-CM | POA: Diagnosis not present

## 2021-08-14 DIAGNOSIS — F331 Major depressive disorder, recurrent, moderate: Secondary | ICD-10-CM

## 2021-08-14 DIAGNOSIS — G4701 Insomnia due to medical condition: Secondary | ICD-10-CM

## 2021-08-14 MED ORDER — LAMOTRIGINE 150 MG PO TABS: 150.0000 mg | ORAL_TABLET | Freq: Two times a day (BID) | ORAL | 3 refills | Status: DC

## 2021-08-14 MED ORDER — FLUOXETINE HCL 40 MG PO CAPS: 40.0000 mg | ORAL_CAPSULE | Freq: Every day | ORAL | 3 refills | Status: DC

## 2021-08-14 MED ORDER — BUPROPION HCL 75 MG PO TABS: 75.0000 mg | ORAL_TABLET | Freq: Every day | ORAL | 0 refills | Status: DC

## 2021-08-14 MED ORDER — HYDROXYZINE PAMOATE 25 MG PO CAPS: ORAL_CAPSULE | ORAL | 1 refills | Status: AC

## 2021-08-14 NOTE — Progress Notes (Signed)
[facility] MD OP Progress Note  08/14/2021 9:07 AM Jamie Baxter  MRN:  [id number]  Chief Complaint:  Chief Complaint  Patient presents with   Follow-up: 71 year old Caucasian female with history of depression, anxiety, insomnia , with recent psychosocial stresses was evaluated in office today.   HPI: Jamie Baxter is a 71 year old Caucasian female, married, retired, lives in [place], has a history of MDD, panic disorder, osteoarthritis, history of thyroid cancer, GERD was evaluated in office today.  Patient today reports she is currently struggling with depressive symptoms, low motivation, anhedonia, low energy, racing thoughts, sleep problems concentration problems since the past few days.  Patient reports her sister with whom she is close is currently hospitalized.  That does worry her.  Her sister seems to be confused and is out of it and that is taking a toll on her.  Patient reports she also has been busy running errands for her sister while she is hospitalized.  That also affects her.  Patient reports due to her racing thoughts she could not sleep a few nights.  She however reports taking the hydroxyzine along with the trazodone helped those nights.  Patient also believes her arthritis could also be causing her tiredness and fatigue.  She does have upcoming appointment with rheumatologist.  Reports she is compliant with her Synthroid and has been monitoring her TSH levels.  Denies any suicidality, homicidality or perceptual disturbances.  Patient denies any other concerns today.  Visit Diagnosis:    ICD-10-CM   1. MDD (major depressive disorder), recurrent episode, moderate (HCC)  F33.1 FLUoxetine (PROZAC) 40 MG capsule    lamoTRIgine (LAMICTAL) 150 MG tablet    buPROPion (WELLBUTRIN) 75 MG tablet    2. Panic disorder  F41.0 FLUoxetine (PROZAC) 40 MG capsule    hydrOXYzine (VISTARIL) 25 MG capsule    3. Insomnia due to medical condition  G47.01    mood      Past Psychiatric  History: Reviewed past psychiatric history from progress note on 08/11/2018.  Past Medical History:  Past Medical History:  Diagnosis Date   Anxiety    Depression    GERD (gastroesophageal reflux disease)    Thyroid disease     Past Surgical History:  Procedure Laterality Date   ABSCESS DRAINAGE     BREAST BIOPSY     BREAST SURGERY     breast biopsy   COLONOSCOPY  2006, 07/17/10   Normal (except melanosis), ? hx polyps in 1990's - [place]   rotator cuff     THYROID LOBECTOMY  1978   goiter   THYROIDECTOMY SUBSTERNAL  2010   left side/cancer/radioactive iodine 2 years in a row for Ca cells   VAGINAL HYSTERECTOMY     fiboid tumors    Family Psychiatric History: Reviewed family psychiatric history from progress note on 08/11/2018.  Family History:  Family History  Problem Relation Age of Onset   GER disease Father    Rheum arthritis Sister    Healthy Sister    Hypertension Mother    Mental illness Neg Hx     Social History: Reviewed social history from progress note on 08/11/2018. Social History   Socioeconomic History   Marital status: Married    Spouse name: monroe   Number of children: 1   Years of education: Not on file   Highest education level: High school graduate  Occupational History   Not on file  Tobacco Use   Smoking status: Former    Types: Cigarettes  Quit date: 02/03/1976    Years since quitting: 45.5   Smokeless tobacco: Never  Vaping Use   Vaping Use: Never used  Substance and Sexual Activity   Alcohol use: No   Drug use: No   Sexual activity: Not Currently  Other Topics Concern   Not on file  Social History Narrative   Not on file   Social Determinants of Health   Financial Resource Strain: Low Risk  (08/11/2018)   Overall Financial Resource Strain (CARDIA)    Difficulty of Paying Living Expenses: Not hard at all  Recent Concern: Financial Resource Strain - High Risk (07/12/2018)   Overall Financial Resource Strain (CARDIA)    Difficulty  of Paying Living Expenses: Very hard  Food Insecurity: No Food Insecurity (08/11/2018)   Hunger Vital Sign    Worried About Running Out of Food in the Last Year: Never true    Ran Out of Food in the Last Year: Never true  Recent Concern: Food Insecurity - Food Insecurity Present (07/12/2018)   Hunger Vital Sign    Worried About Running Out of Food in the Last Year: Often true    Ran Out of Food in the Last Year: Often true  Transportation Needs: No Transportation Needs (07/12/2018)   PRAPARE - Hydrologist (Medical): No    Lack of Transportation (Non-Medical): No  Physical Activity: Insufficiently Active (07/12/2018)   Exercise Vital Sign    Days of Exercise per Week: 5 days    Minutes of Exercise per Session: 20 min  Stress: No Stress Concern Present (07/12/2018)   [place]    Feeling of Stress : Not at all  Social Connections: Unknown (07/12/2018)   Social Connection and Isolation Panel [NHANES]    Frequency of Communication with Friends and Family: Not on file    Frequency of Social Gatherings with Friends and Family: Not on file    Attends Religious Services: More than 4 times per year    Active Member of [employer] or Organizations: No    Attends Archivist Meetings: Never    Marital Status: Married    Allergies:  Allergies  Allergen Reactions   Amantadine Other (See Comments)    disorientation   Benztropine    Geodon  [Ziprasidone Hcl] Other (See Comments)    disorientation   Sulfa Antibiotics    Z-Pak [Azithromycin]     hallucinations    Metabolic Disorder Labs: Lab Results  Component Value Date   HGBA1C 5.7 (H) 09/19/2020   MPG 116.89 09/19/2020   Lab Results  Component Value Date   PROLACTIN 15.8 09/19/2020   Lab Results  Component Value Date   CHOL 235 (H) 11/28/2020   TRIG 94 11/28/2020   HDL 63 11/28/2020   CHOLHDL 3.7 11/28/2020   VLDL 19 11/28/2020    LDLCALC 153 (H) 11/28/2020   No results found for: "TSH"  Therapeutic Level Labs: No results found for: "LITHIUM" No results found for: "VALPROATE" No results found for: "CBMZ"  Current Medications: Current Outpatient Medications  Medication Sig Dispense Refill   acetaminophen (TYLENOL) 325 MG tablet Take by mouth.     B Complex Vitamins (B COMPLEX PO) Take by mouth.     buPROPion (WELLBUTRIN) 75 MG tablet Take 1 tablet (75 mg total) by mouth daily with breakfast. 90 tablet 0   celecoxib (CELEBREX) 100 MG capsule Take 100 mg by mouth 2 (two) times daily.  Coenzyme Q10 (COQ10 PO) Take by mouth.     MILK THISTLE PO Take by mouth.     Multiple Vitamin (MULTI-VITAMIN) tablet Take 1 tablet by mouth daily.     neomycin-polymyxin b-dexamethasone (MAXITROL) 3.5-10000-0.1 OINT SMARTSIG:1 sparingly Right Eye 3 Times Daily     omeprazole (PRILOSEC) 40 MG capsule TAKE 1 CAPSULE BY MOUTH EVERY DAY 90 capsule 0   SM CALCIUM 600/VITAMIN D 600-400 MG-UNIT tablet Take 1 tablet by mouth 2 (two) times daily.     SYNTHROID 150 MCG tablet Take 150 mcg by mouth daily.     traZODone (DESYREL) 100 MG tablet TAKE 1 AND 1/2 TO 2 TABLETS BY MOUTH AT BEDTIME AS  NEEDED FOR SLEEP 180 tablet 3   triamcinolone cream (KENALOG) 0.1 % TRIAMCINOLONE ACETONIDE, 0.1% (External Cream)  1 (one) Cream Cream use as directed for 0 days  Quantity: 30;  Refills: 1   Ordered :12-Jan-2014  Meyer Cory ;  Started 12-Jan-2014 Active     FLUoxetine (PROZAC) 40 MG capsule Take 1 capsule (40 mg total) by mouth daily. Has supplies 90 capsule 3   hydrOXYzine (VISTARIL) 25 MG capsule TAKE 1 CAP BY MOUTH DAILY AS NEEDED TO BE TAKEN 1-3 TIMES A WEEK AS NEEDED FOR SEVERE PANIC ATTACKS 90 capsule 1   lamoTRIgine (LAMICTAL) 150 MG tablet Take 1 tablet (150 mg total) by mouth 2 (two) times daily. 180 tablet 3   Current Facility-Administered Medications  Medication Dose Route Frequency Provider Last Rate Last Admin   0.9 %  sodium  chloride infusion  500 mL Intravenous Continuous Gatha Mayer, MD         Musculoskeletal: Strength & Muscle Tone: within normal limits Gait & Station: normal Patient leans: N/A  Psychiatric Specialty Exam: Review of Systems  Constitutional:  Positive for fatigue.  Musculoskeletal:  Positive for arthralgias.  Psychiatric/Behavioral:  Positive for decreased concentration, dysphoric mood and sleep disturbance. The patient is nervous/anxious.   All other systems reviewed and are negative.   Blood pressure 136/79, pulse 78, temperature 98.5 F (36.9 C), temperature source Temporal, weight 207 lb 3.2 oz (94 kg).Body mass index is 32.14 kg/m.  General Appearance: Casual  Eye Contact:  Fair  Speech:  Clear and Coherent  Volume:  Normal  Mood:  Anxious and Depressed  Affect:  Congruent  Thought Process:  Goal Directed and Descriptions of Associations: Intact  Orientation:  Full (Time, Place, and Person)  Thought Content: Logical   Suicidal Thoughts:  No  Homicidal Thoughts:  No  Memory:  Immediate;   Fair Recent;   Fair Remote;   Fair  Judgement:  Fair  Insight:  Good  Psychomotor Activity:  Normal  Concentration:  Concentration: Fair and Attention Span: Fair  Recall:  [employer] of Knowledge: Fair  Language: Fair  Akathisia:  No  Handed:  Right  AIMS (if indicated): done  Assets:  Communication Skills Desire for Improvement Housing Social Support  ADL's:  Intact  Cognition: WNL  Sleep:   restlless    Screenings: [place] Office Visit from 08/14/2021 in [place] Office Visit from 05/23/2021 in [place] Office Visit from 11/26/2020 in [place] Office Visit from 08/20/2020 in [place] Total Score 0 0 0 [place] Visit from 08/14/2021 in [place] Visit from  01/15/2021 in [facility] Office Visit from 08/20/2020 in [place]  Total GAD-7 Score 4 0 0      PHQ2-9    [place] Visit from 08/14/2021 in [facility] Office Visit from 05/23/2021 in [place] Office Visit from 03/05/2021 in [place] [facility] Office Visit from 01/15/2021 in [place] Visit from 11/26/2020 in [facility]  PHQ-2 Total Score 1 0 0 0 0  PHQ-9 Total Score 9 0 -- -- --      [place] Visit from 08/14/2021 in [place] Office Visit from 05/23/2021 in [place] Office Visit from 08/20/2020 in [place] No Risk No Risk No Risk        Assessment and Plan: Jamie Baxter is a 71 year old Caucasian female who has a history of MDD, panic attacks, GERD, thyroid cancer in remission, osteoarthritis was evaluated in office today.  Patient currently struggling with psychosocial stressors of her sister who is currently hospitalized, currently struggling with depression, anxiety and sleep problems, will benefit from the following plan.  Plan MDD--unstable Reduce Prozac to 40 mg p.o. daily Add Wellbutrin 75 mg p.o. daily in the morning. Lamotrigine 150 mg p.o. twice daily Discontinue Seroquel-currently not on it.  Panic disorder-stable Prozac as prescribed, reduced dosage. Hydroxyzine 25 mg as needed for severe anxiety attacks  Insomnia-improving Trazodone 150-200 mg p.o. nightly as needed Continue hydroxyzine as needed at night for sleep.   Patient advised to establish care with a therapist, she could call her health insurance to get a list.  Discussed signing an ROI to request thyroid labs from her provider.    Follow-up in clinic in 4 to  5 weeks or sooner if needed.  This note was generated in part or whole with voice recognition software. Voice recognition is usually quite accurate but there are transcription errors that can and very often do occur. I apologize for any typographical errors that were not detected and corrected.      Ursula Alert, MD 08/15/2021, 7:58 AM

## 2021-08-22 ENCOUNTER — Telehealth: Payer: Medicare Other | Admitting: Psychiatry

## 2021-09-02 DIAGNOSIS — K51919 Ulcerative colitis, unspecified with unspecified complications: Secondary | ICD-10-CM | POA: Diagnosis not present

## 2021-09-02 DIAGNOSIS — Z791 Long term (current) use of non-steroidal anti-inflammatories (NSAID): Secondary | ICD-10-CM | POA: Diagnosis not present

## 2021-09-02 DIAGNOSIS — M159 Polyosteoarthritis, unspecified: Secondary | ICD-10-CM | POA: Diagnosis not present

## 2021-09-04 ENCOUNTER — Other Ambulatory Visit: Payer: Self-pay | Admitting: Family Medicine

## 2021-09-04 DIAGNOSIS — L989 Disorder of the skin and subcutaneous tissue, unspecified: Secondary | ICD-10-CM | POA: Diagnosis not present

## 2021-09-04 DIAGNOSIS — Z1231 Encounter for screening mammogram for malignant neoplasm of breast: Secondary | ICD-10-CM

## 2021-09-25 ENCOUNTER — Ambulatory Visit: Payer: Medicare Other | Admitting: Psychiatry

## 2021-10-08 DIAGNOSIS — S46812A Strain of other muscles, fascia and tendons at shoulder and upper arm level, left arm, initial encounter: Secondary | ICD-10-CM | POA: Diagnosis not present

## 2021-10-08 DIAGNOSIS — M545 Low back pain, unspecified: Secondary | ICD-10-CM | POA: Diagnosis not present

## 2021-10-08 DIAGNOSIS — N1831 Chronic kidney disease, stage 3a: Secondary | ICD-10-CM | POA: Diagnosis not present

## 2021-10-15 ENCOUNTER — Other Ambulatory Visit: Payer: Self-pay | Admitting: Psychiatry

## 2021-10-15 DIAGNOSIS — M25512 Pain in left shoulder: Secondary | ICD-10-CM | POA: Diagnosis not present

## 2021-10-15 DIAGNOSIS — F331 Major depressive disorder, recurrent, moderate: Secondary | ICD-10-CM

## 2021-10-22 ENCOUNTER — Other Ambulatory Visit: Payer: Self-pay | Admitting: Family Medicine

## 2021-10-22 DIAGNOSIS — M25512 Pain in left shoulder: Secondary | ICD-10-CM

## 2021-10-24 DIAGNOSIS — H52223 Regular astigmatism, bilateral: Secondary | ICD-10-CM | POA: Diagnosis not present

## 2021-10-24 DIAGNOSIS — H2512 Age-related nuclear cataract, left eye: Secondary | ICD-10-CM | POA: Diagnosis not present

## 2021-10-24 DIAGNOSIS — H2511 Age-related nuclear cataract, right eye: Secondary | ICD-10-CM | POA: Diagnosis not present

## 2021-10-27 ENCOUNTER — Ambulatory Visit
Admission: RE | Admit: 2021-10-27 | Discharge: 2021-10-27 | Disposition: A | Discharge status: Home / Self Care | Payer: Medicare Other | Source: Ambulatory Visit | Attending: Family Medicine | Admitting: Family Medicine

## 2021-10-27 DIAGNOSIS — M25512 Pain in left shoulder: Secondary | ICD-10-CM | POA: Diagnosis not present

## 2021-10-29 ENCOUNTER — Ambulatory Visit: Payer: Medicare Other

## 2021-10-30 DIAGNOSIS — L570 Actinic keratosis: Secondary | ICD-10-CM | POA: Diagnosis not present

## 2021-10-30 DIAGNOSIS — L578 Other skin changes due to chronic exposure to nonionizing radiation: Secondary | ICD-10-CM | POA: Diagnosis not present

## 2021-11-03 DIAGNOSIS — M75122 Complete rotator cuff tear or rupture of left shoulder, not specified as traumatic: Secondary | ICD-10-CM | POA: Diagnosis not present

## 2021-11-05 ENCOUNTER — Encounter: Payer: Self-pay | Admitting: Psychiatry

## 2021-11-05 ENCOUNTER — Ambulatory Visit (INDEPENDENT_AMBULATORY_CARE_PROVIDER_SITE_OTHER): Payer: Medicare Other | Admitting: Psychiatry

## 2021-11-05 VITALS — BP 101/66 | HR 85 | Temp 98.7°F | Wt 208.2 lb

## 2021-11-05 DIAGNOSIS — G4701 Insomnia due to medical condition: Secondary | ICD-10-CM | POA: Diagnosis not present

## 2021-11-05 DIAGNOSIS — F3342 Major depressive disorder, recurrent, in full remission: Secondary | ICD-10-CM | POA: Diagnosis not present

## 2021-11-05 DIAGNOSIS — F410 Panic disorder [episodic paroxysmal anxiety] without agoraphobia: Secondary | ICD-10-CM

## 2021-11-05 MED ORDER — FLUOXETINE HCL 40 MG PO CAPS: 40.0000 mg | ORAL_CAPSULE | Freq: Every day | ORAL | 3 refills | Status: DC

## 2021-11-05 MED ORDER — FLUOXETINE HCL 20 MG PO CAPS: 20.0000 mg | ORAL_CAPSULE | Freq: Every day | ORAL | 3 refills | Status: DC

## 2021-11-05 NOTE — Progress Notes (Unsigned)
[facility] MD OP Progress Note  11/05/2021 1:39 PM Jamie Baxter  MRN:  [id number]  Chief Complaint:  Chief Complaint  Patient presents with   Follow-up   Anxiety   Depression   HPI: Jamie Baxter is a 71 year old Caucasian female, married, retired, lives in [place], has a history of MDD, panic disorder, osteoarthritis, history of thyroid cancer, GERD was evaluated in office today.  Patient today reports the Wellbutrin has helped her a lot.  She does not feel as depressed as she used to before.  Feels more energetic, denies any anhedonia.  Denies any racing thoughts at this time.  Does have some concentration problems although that has improved as well.  Denies any side effects to Wellbutrin.  Currently on Prozac 60 mg and reports she did not reduce the dosage as discussed last visit to 40 mg.  Tolerating the Prozac 60 mg in combination with the Wellbutrin, denies any drug to drug interactions or side effects at this time.  Would like to stay on this dosage for now.  Patient reports sleep is overall good.  Goes to bed at around 8 PM.  Wakes up at around 4 AM.  Reports she does some Bible study for a hour or so and then goes back to bed for an hour.  Feels rested when she wakes up.  Does have pain of her left shoulder, recently had an injury, has upcoming appointment for surgery coming up.  Currently on gabapentin and Celebrex for pain management.  That does help.  Patient reports her sister is currently discharged from the hospital and doing well.  That has been a relief.  Denies any suicidality, homicidality or perceptual disturbances.  Patient denies any other concerns today.  Visit Diagnosis:    ICD-10-CM   1. MDD (major depressive disorder), recurrent, in full remission ([place])  F33.42 FLUoxetine (PROZAC) 20 MG capsule    2. Panic disorder  F41.0 FLUoxetine (PROZAC) 20 MG capsule    FLUoxetine (PROZAC) 40 MG capsule    3. Insomnia due to medical condition  G47.01    mood, pain       Past Psychiatric History: Reviewed past psychiatric history from progress note on 08/11/2018.  Past Medical History:  Past Medical History:  Diagnosis Date   Anxiety    Depression    GERD (gastroesophageal reflux disease)    Thyroid disease     Past Surgical History:  Procedure Laterality Date   ABSCESS DRAINAGE     BREAST BIOPSY     BREAST SURGERY     breast biopsy   COLONOSCOPY  2006, 07/17/10   Normal (except melanosis), ? hx polyps in 1990's - [place]   rotator cuff     THYROID LOBECTOMY  1978   goiter   THYROIDECTOMY SUBSTERNAL  2010   left side/cancer/radioactive iodine 2 years in a row for Ca cells   VAGINAL HYSTERECTOMY     fiboid tumors    Family Psychiatric History: Reviewed family psychiatric history from progress note on 08/11/2018.  Family History:  Family History  Problem Relation Age of Onset   GER disease Father    Rheum arthritis Sister    Healthy Sister    Hypertension Mother    Mental illness Neg Hx     Social History: Reviewed social history from progress note on 08/11/2018. Social History   Socioeconomic History   Marital status: Married    Spouse name: monroe   Number of children: 1   Years of education:  Not on file   Highest education level: High school graduate  Occupational History   Not on file  Tobacco Use   Smoking status: Former    Types: Cigarettes    Quit date: 02/03/1976    Years since quitting: 45.7   Smokeless tobacco: Never  Vaping Use   Vaping Use: Never used  Substance and Sexual Activity   Alcohol use: No   Drug use: No   Sexual activity: Not Currently  Other Topics Concern   Not on file  Social History Narrative   Not on file   Social Determinants of Health   Financial Resource Strain: Low Risk  (08/11/2018)   Overall Financial Resource Strain (CARDIA)    Difficulty of Paying Living Expenses: Not hard at all  Recent Concern: Financial Resource Strain - High Risk (07/12/2018)   Overall Financial Resource Strain  (CARDIA)    Difficulty of Paying Living Expenses: Very hard  Food Insecurity: No Food Insecurity (08/11/2018)   Hunger Vital Sign    Worried About Running Out of Food in the Last Year: Never true    [place] in the Last Year: Never true  Recent Concern: [employer] Present (07/12/2018)   Hunger Vital Sign    Worried About Running Out of Food in the Last Year: Often true    Ran Out of Food in the Last Year: Often true  Transportation Needs: No Transportation Needs (07/12/2018)   PRAPARE - Hydrologist (Medical): No    Lack of Transportation (Non-Medical): No  Physical Activity: Insufficiently Active (07/12/2018)   Exercise Vital Sign    Days of Exercise per Week: 5 days    Minutes of Exercise per Session: 20 min  Stress: No Stress Concern Present (07/12/2018)   [place]    Feeling of Stress : Not at all  Social Connections: Unknown (07/12/2018)   Social Connection and Isolation Panel [NHANES]    Frequency of Communication with Friends and Family: Not on file    Frequency of Social Gatherings with Friends and Family: Not on file    Attends Religious Services: More than 4 times per year    Active Member of [employer] or Organizations: No    Attends Archivist Meetings: Never    Marital Status: Married    Allergies:  Allergies  Allergen Reactions   Amantadine Other (See Comments)    disorientation   Benztropine    Geodon  [Ziprasidone Hcl] Other (See Comments)    disorientation   Sulfa Antibiotics    Z-Pak [Azithromycin]     hallucinations    Metabolic Disorder Labs: Lab Results  Component Value Date   HGBA1C 5.7 (H) 09/19/2020   MPG 116.89 09/19/2020   Lab Results  Component Value Date   PROLACTIN 15.8 09/19/2020   Lab Results  Component Value Date   CHOL 235 (H) 11/28/2020   TRIG 94 11/28/2020   HDL 63 11/28/2020   CHOLHDL 3.7 11/28/2020    VLDL 19 11/28/2020   LDLCALC 153 (H) 11/28/2020   No results found for: "TSH"  Therapeutic Level Labs: No results found for: "LITHIUM" No results found for: "VALPROATE" No results found for: "CBMZ"  Current Medications: Current Outpatient Medications  Medication Sig Dispense Refill   acetaminophen (TYLENOL) 325 MG tablet Take by mouth.     B Complex Vitamins (B COMPLEX PO) Take by mouth.     buPROPion [facility])  75 MG tablet TAKE 1 TABLET BY MOUTH DAILY  WITH BREAKFAST 90 tablet 3   celecoxib (CELEBREX) 100 MG capsule Take 100 mg by mouth 2 (two) times daily.     Coenzyme Q10 (COQ10 PO) Take by mouth.     FLUoxetine (PROZAC) 20 MG capsule Take 1 capsule (20 mg total) by mouth daily. Take along with 40 mg daily 90 capsule 3   gabapentin (NEURONTIN) 100 MG capsule Take 1 capsule by mouth 2 (two) times daily.     hydrOXYzine (VISTARIL) 25 MG capsule TAKE 1 CAP BY MOUTH DAILY AS NEEDED TO BE TAKEN 1-3 TIMES A WEEK AS NEEDED FOR SEVERE PANIC ATTACKS 90 capsule 1   lamoTRIgine (LAMICTAL) 150 MG tablet Take 1 tablet (150 mg total) by mouth 2 (two) times daily. 180 tablet 3   MILK THISTLE PO Take by mouth.     Multiple Vitamin (MULTI-VITAMIN) tablet Take 1 tablet by mouth daily.     neomycin-polymyxin b-dexamethasone (MAXITROL) 3.5-10000-0.1 OINT SMARTSIG:1 sparingly Right Eye 3 Times Daily     omeprazole (PRILOSEC) 40 MG capsule TAKE 1 CAPSULE BY MOUTH EVERY DAY 90 capsule 0   SM CALCIUM 600/VITAMIN D 600-400 MG-UNIT tablet Take 1 tablet by mouth 2 (two) times daily.     SYNTHROID 150 MCG tablet Take 150 mcg by mouth daily.     traZODone (DESYREL) 100 MG tablet TAKE 1 AND 1/2 TO 2 TABLETS BY MOUTH AT BEDTIME AS  NEEDED FOR SLEEP 180 tablet 3   triamcinolone cream (KENALOG) 0.1 % TRIAMCINOLONE ACETONIDE, 0.1% (External Cream)  1 (one) Cream Cream use as directed for 0 days  Quantity: 30;  Refills: 1   Ordered :12-Jan-2014  Meyer Cory ;  Started 12-Jan-2014 Active     celecoxib  (CELEBREX) 100 MG capsule Take 1 capsule by mouth 2 (two) times daily. (Patient not taking: Reported on 11/05/2021)     FLUoxetine (PROZAC) 40 MG capsule Take 1 capsule (40 mg total) by mouth daily. Takes a 20 mg along with it 90 capsule 3   Current Facility-Administered Medications  Medication Dose Route Frequency Provider Last Rate Last Admin   0.9 %  sodium chloride infusion  500 mL Intravenous Continuous Gatha Mayer, MD         Musculoskeletal: Strength & Muscle Tone: within normal limits Gait & Station: normal Patient leans: N/A  Psychiatric Specialty Exam: Review of Systems  Musculoskeletal:        Left shoulder pain  Psychiatric/Behavioral: Negative.    All other systems reviewed and are negative.   There were no vitals taken for this visit.There is no height or weight on file to calculate BMI.  General Appearance: Casual  Eye Contact:  Fair  Speech:  Clear and Coherent  Volume:  Normal  Mood:  Euthymic  Affect:  Congruent  Thought Process:  Goal Directed and Descriptions of Associations: Intact  Orientation:  Full (Time, Place, and Person)  Thought Content: Logical   Suicidal Thoughts:  No  Homicidal Thoughts:  No  Memory:  Immediate;   Fair Recent;   Fair Remote;   Fair  Judgement:  Fair  Insight:  Fair  Psychomotor Activity:  Normal  Concentration:  Concentration: Fair and Attention Span: Fair  Recall:  [employer] of Knowledge: Fair  Language: Fair  Akathisia:  No  Handed:  Right  AIMS (if indicated): not done  Assets:  Communication Skills Desire for [place] Talents/Skills Transportation  ADL's:  Intact  Cognition: WNL  Sleep:  Fair   Screenings: [place] Office Visit from 08/14/2021 in [place] Office Visit from 05/23/2021 in [place] Office Visit from 11/26/2020 in [place] Office Visit from 08/20/2020 in  [place] Total Score 0 0 0 0      [place] Visit from 11/05/2021 in [place] Office Visit from 08/14/2021 in [place] Office Visit from 01/15/2021 in [place] Office Visit from 08/20/2020 in [place]  Total GAD-7 Score 0 4 0 0      PHQ2-9    [place] Visit from 11/05/2021 in [place] Office Visit from 08/14/2021 in [place] Office Visit from 05/23/2021 in [place] Office Visit from 03/05/2021 in [place] Office Visit from 01/15/2021 in [place]  PHQ-2 Total Score 0 1 0 0 0  PHQ-9 Total Score 1 9 0 -- --      [place] Office Visit from 11/05/2021 in [place] Office Visit from 08/14/2021 in [place] Office Visit from 05/23/2021 in [place] No Risk No Risk No Risk        Assessment and Plan: DANASHA MELMAN is a 71 year old Caucasian female who has a history of MDD, panic attacks, GERD, thyroid cancer in remission, osteoarthritis was evaluated in office today.  Patient is currently stable.  Plan as noted below.  Plan MDD in remission Change Prozac to 60 mg p.o. daily, patient did not reduce the dosage to 40 mg as discussed last visit.  Will continue the same dose now. Wellbutrin 75 mg p.o. daily in the morning Lamotrigine 150 mg p.o. twice daily   Panic disorder-stable Prozac as prescribed Hydroxyzine 25 mg as needed for severe anxiety attacks  Insomnia-stable Trazodone 150-200 mg p.o. nightly as needed  Patient advised to sign an ROI to request labs including thyroid, sodium level from her primary care  provider.  Follow-up in clinic in 3 months or sooner if needed.   This note was generated in part or whole with voice recognition software. Voice recognition is usually quite accurate but there are transcription errors that can and very often do occur. I apologize for any typographical errors that were not detected and corrected.       Ursula Alert, MD 11/05/2021, 1:39 PM

## 2021-11-06 DIAGNOSIS — E7800 Pure hypercholesterolemia, unspecified: Secondary | ICD-10-CM | POA: Diagnosis not present

## 2021-11-06 DIAGNOSIS — N1831 Chronic kidney disease, stage 3a: Secondary | ICD-10-CM | POA: Diagnosis not present

## 2021-11-06 DIAGNOSIS — Z79899 Other long term (current) drug therapy: Secondary | ICD-10-CM | POA: Diagnosis not present

## 2021-11-10 DIAGNOSIS — M75122 Complete rotator cuff tear or rupture of left shoulder, not specified as traumatic: Secondary | ICD-10-CM | POA: Diagnosis not present

## 2021-11-14 ENCOUNTER — Telehealth: Payer: Self-pay | Admitting: Psychiatry

## 2021-11-14 NOTE — Telephone Encounter (Signed)
Reviewed labs-received from [facility]  Vitamin B12-907  CMP-within normal limits   Magnesium-2.2-within normal limits  CBC with differential-within normal limits including platelets at 320  Lipid panel-within normal limits except for LDL elevated at 120  Labs reported on 11/07/2021.

## 2021-11-25 DIAGNOSIS — H2512 Age-related nuclear cataract, left eye: Secondary | ICD-10-CM | POA: Diagnosis not present

## 2021-11-25 HISTORY — PX: ROTATOR CUFF REPAIR: SHX139

## 2021-11-27 DIAGNOSIS — M75122 Complete rotator cuff tear or rupture of left shoulder, not specified as traumatic: Secondary | ICD-10-CM | POA: Diagnosis not present

## 2021-11-27 DIAGNOSIS — M75112 Incomplete rotator cuff tear or rupture of left shoulder, not specified as traumatic: Secondary | ICD-10-CM | POA: Diagnosis not present

## 2021-11-27 DIAGNOSIS — M24112 Other articular cartilage disorders, left shoulder: Secondary | ICD-10-CM | POA: Diagnosis not present

## 2021-11-27 DIAGNOSIS — M7542 Impingement syndrome of left shoulder: Secondary | ICD-10-CM | POA: Diagnosis not present

## 2021-11-27 DIAGNOSIS — G8918 Other acute postprocedural pain: Secondary | ICD-10-CM | POA: Diagnosis not present

## 2021-11-27 DIAGNOSIS — M7552 Bursitis of left shoulder: Secondary | ICD-10-CM | POA: Diagnosis not present

## 2021-12-08 DIAGNOSIS — M25512 Pain in left shoulder: Secondary | ICD-10-CM | POA: Diagnosis not present

## 2021-12-10 DIAGNOSIS — M25512 Pain in left shoulder: Secondary | ICD-10-CM | POA: Diagnosis not present

## 2021-12-16 DIAGNOSIS — M25512 Pain in left shoulder: Secondary | ICD-10-CM | POA: Diagnosis not present

## 2021-12-17 ENCOUNTER — Encounter: Payer: Self-pay | Admitting: Ophthalmology

## 2021-12-22 DIAGNOSIS — M25512 Pain in left shoulder: Secondary | ICD-10-CM | POA: Diagnosis not present

## 2021-12-23 ENCOUNTER — Encounter: Payer: Self-pay | Admitting: Ophthalmology

## 2021-12-23 ENCOUNTER — Ambulatory Visit: Payer: Medicare Other | Admitting: Anesthesiology

## 2021-12-23 ENCOUNTER — Ambulatory Visit
Admission: RE | Admit: 2021-12-23 | Discharge: 2021-12-23 | Disposition: A | Discharge status: Home / Self Care | Payer: Medicare Other | Source: Ambulatory Visit | Attending: Ophthalmology | Admitting: Ophthalmology

## 2021-12-23 ENCOUNTER — Other Ambulatory Visit: Payer: Self-pay

## 2021-12-23 ENCOUNTER — Encounter
Admission: RE | Disposition: A | Discharge status: Home / Self Care | Payer: Self-pay | Source: Ambulatory Visit | Attending: Ophthalmology

## 2021-12-23 DIAGNOSIS — Z87891 Personal history of nicotine dependence: Secondary | ICD-10-CM | POA: Insufficient documentation

## 2021-12-23 DIAGNOSIS — Z85850 Personal history of malignant neoplasm of thyroid: Secondary | ICD-10-CM | POA: Diagnosis not present

## 2021-12-23 DIAGNOSIS — F419 Anxiety disorder, unspecified: Secondary | ICD-10-CM | POA: Diagnosis not present

## 2021-12-23 DIAGNOSIS — K219 Gastro-esophageal reflux disease without esophagitis: Secondary | ICD-10-CM | POA: Insufficient documentation

## 2021-12-23 DIAGNOSIS — F32A Depression, unspecified: Secondary | ICD-10-CM | POA: Diagnosis not present

## 2021-12-23 DIAGNOSIS — H2512 Age-related nuclear cataract, left eye: Secondary | ICD-10-CM | POA: Diagnosis not present

## 2021-12-23 DIAGNOSIS — T7840XA Allergy, unspecified, initial encounter: Secondary | ICD-10-CM | POA: Diagnosis not present

## 2021-12-23 HISTORY — DX: Motion sickness, initial encounter: T75.3XXA

## 2021-12-23 HISTORY — PX: CATARACT EXTRACTION W/PHACO: SHX586

## 2021-12-23 SURGERY — PHACOEMULSIFICATION, CATARACT, WITH IOL INSERTION
Anesthesia: Monitor Anesthesia Care | Site: Eye | Laterality: Left

## 2021-12-23 MED ORDER — MIDAZOLAM HCL 2 MG/2ML IJ SOLN
INTRAMUSCULAR | Status: DC | PRN
  Administered 2021-12-23: 2 mg via INTRAVENOUS

## 2021-12-23 MED ORDER — FENTANYL CITRATE (PF) 100 MCG/2ML IJ SOLN
INTRAMUSCULAR | Status: DC | PRN
  Administered 2021-12-23: 100 ug via INTRAVENOUS

## 2021-12-23 MED ORDER — LACTATED RINGERS IV SOLN: INTRAVENOUS | Status: DC

## 2021-12-23 MED ORDER — SIGHTPATH DOSE#1 NA CHONDROIT SULF-NA HYALURON 40-17 MG/ML IO SOLN
INTRAOCULAR | Status: DC | PRN
  Administered 2021-12-23: 1 mL via INTRAOCULAR

## 2021-12-23 MED ORDER — TETRACAINE HCL 0.5 % OP SOLN
1.0000 [drp] | OPHTHALMIC | Status: AC | PRN
  Administered 2021-12-23 (×3): 1 [drp] via OPHTHALMIC

## 2021-12-23 MED ORDER — SIGHTPATH DOSE#1 BSS IO SOLN
INTRAOCULAR | Status: DC | PRN
  Administered 2021-12-23: 15 mL

## 2021-12-23 MED ORDER — SIGHTPATH DOSE#1 BSS IO SOLN
INTRAOCULAR | Status: DC | PRN
  Administered 2021-12-23: 1 mL via INTRAMUSCULAR

## 2021-12-23 MED ORDER — MOXIFLOXACIN HCL 0.5 % OP SOLN
OPHTHALMIC | Status: DC | PRN
  Administered 2021-12-23: .2 mL via OPHTHALMIC

## 2021-12-23 MED ORDER — SIGHTPATH DOSE#1 BSS IO SOLN
INTRAOCULAR | Status: DC | PRN
  Administered 2021-12-23: 62 mL via OPHTHALMIC

## 2021-12-23 MED ORDER — BRIMONIDINE TARTRATE-TIMOLOL 0.2-0.5 % OP SOLN
OPHTHALMIC | Status: DC | PRN
  Administered 2021-12-23: 1 [drp] via OPHTHALMIC

## 2021-12-23 MED ORDER — ARMC OPHTHALMIC DILATING DROPS
1.0000 | OPHTHALMIC | Status: AC | PRN
  Administered 2021-12-23 (×3): 1 via OPHTHALMIC

## 2021-12-23 SURGICAL SUPPLY — 9 items
CATARACT SUITE SIGHTPATH (MISCELLANEOUS) ×1 IMPLANT
FEE CATARACT SUITE SIGHTPATH (MISCELLANEOUS) ×2 IMPLANT
GLOVE SURG ENC TEXT LTX SZ8 (GLOVE) ×2 IMPLANT
GLOVE SURG TRIUMPH 8.0 PF LTX (GLOVE) ×2 IMPLANT
LENS IOL TECNIS EYHANCE 12.0 (Intraocular Lens) IMPLANT
NDL FILTER BLUNT 18X1 1/2 (NEEDLE) ×2 IMPLANT
NEEDLE FILTER BLUNT 18X1 1/2 (NEEDLE) ×1 IMPLANT
SYR 3ML LL SCALE MARK (SYRINGE) ×2 IMPLANT
WATER STERILE IRR 250ML POUR (IV SOLUTION) ×2 IMPLANT

## 2021-12-23 NOTE — Op Note (Signed)
PREOPERATIVE DIAGNOSIS:  Nuclear sclerotic cataract of the left eye.   POSTOPERATIVE DIAGNOSIS:  Nuclear sclerotic cataract of the left eye.   OPERATIVE PROCEDURE:ORPROCALL@   SURGEON:  Birder Robson, MD.   ANESTHESIA:  Anesthesiologist: Dimas Millin, MD CRNA: Tobie Poet, CRNA  1.      Managed anesthesia care. 2.     0.72m of Shugarcaine was instilled following the paracentesis   COMPLICATIONS:  None.   TECHNIQUE:   Stop and chop   DESCRIPTION OF PROCEDURE:  The patient was examined and consented in the preoperative holding area where the aforementioned topical anesthesia was applied to the left eye and then brought back to the Operating Room where the left eye was prepped and draped in the usual sterile ophthalmic fashion and a lid speculum was placed. A paracentesis was created with the side port blade and the anterior chamber was filled with viscoelastic. A near clear corneal incision was performed with the steel keratome. A continuous curvilinear capsulorrhexis was performed with a cystotome followed by the capsulorrhexis forceps. Hydrodissection and hydrodelineation were carried out with BSS on a blunt cannula. The lens was removed in a stop and chop  technique and the remaining cortical material was removed with the irrigation-aspiration handpiece. The capsular bag was inflated with viscoelastic and the Technis [id number] lens was placed in the capsular bag without complication. The remaining viscoelastic was removed from the eye with the irrigation-aspiration handpiece. The wounds were hydrated. The anterior chamber was flushed with BSS and the eye was inflated to physiologic pressure. 0.157mVigamox was placed in the anterior chamber. The wounds were found to be water tight. The eye was dressed with Combigan. The patient was given protective glasses to wear throughout the day and a shield with which to sleep tonight. The patient was also given drops with which to begin a drop  regimen today and will follow-up with me in one day. Implant Name Type Inv. Item Serial No. Manufacturer Lot No. LRB No. Used Action  LENS IOL TECNIS EYHANCE 12.0 - [id number] Lens LENS IOL TECNIS EYHANCE 12.0 [id number]  Left 1 Implanted    Procedure(s): CATARACT EXTRACTION PHACO AND INTRAOCULAR LENS PLACEMENT (IOC) LEFT  4.14  00:36.2 (Left)  Electronically signed: WiBirder Robson1/21/2023 1:27 PM

## 2021-12-23 NOTE — Discharge Instructions (Signed)

## 2021-12-23 NOTE — H&P (Signed)
[facility]   Primary Care Physician:  Leonides Sake, MD Ophthalmologist: Dr. George Ina   Pre-Procedure History & Physical: HPI:  Jamie Baxter is a 71 y.o. female here for cataract surgery.   Past Medical History:  Diagnosis Date   Anxiety    Depression    GERD (gastroesophageal reflux disease)    Motion sickness    cars - [place]   Thyroid disease     Past Surgical History:  Procedure Laterality Date   ABSCESS DRAINAGE     BREAST BIOPSY     BREAST SURGERY     breast biopsy   COLONOSCOPY  2006, 07/17/10   Normal (except melanosis), ? 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 Authorizing Provider  acetaminophen (TYLENOL) 325 MG tablet Take by mouth.   Yes [provider]  B Complex Vitamins (B COMPLEX PO) Take by mouth.   Yes [provider]  buPROPion (WELLBUTRIN) 75 MG tablet TAKE 1 TABLET BY MOUTH DAILY  WITH BREAKFAST 10/15/21  Yes Eappen, Ria Clock, MD  celecoxib (CELEBREX) 100 MG capsule Take 1 capsule by mouth 2 (two) times daily. 09/02/21  Yes [provider]  Coenzyme Q10 (COQ10 PO) Take by mouth.   Yes [provider]  FLUoxetine (PROZAC) 20 MG capsule Take 1 capsule (20 mg total) by mouth daily. Take along with 40 mg daily 11/05/21  Yes Eappen, Ria Clock, MD  FLUoxetine (PROZAC) 40 MG capsule Take 1 capsule (40 mg total) by mouth daily. Takes a 20 mg along with it 11/05/21  Yes Eappen, Saramma, MD  fluticasone (FLONASE) 50 MCG/ACT nasal spray Place into both nostrils daily.   Yes [provider]  gabapentin (NEURONTIN) 100 MG capsule Take 1 capsule by mouth 2 (two) times daily. 09/02/21 09/02/22 Yes  [provider]  Glucosamine-Chondroitin (GLUCOSAMINE CHONDR COMPLEX PO) Take by mouth daily.   Yes [provider]  hydrOXYzine (VISTARIL) 25 MG capsule TAKE 1 CAP BY MOUTH DAILY AS NEEDED TO BE TAKEN 1-3 TIMES A WEEK AS NEEDED FOR SEVERE PANIC ATTACKS 08/14/21  Yes Ursula Alert, MD  lamoTRIgine (LAMICTAL) 150 MG tablet Take 1 tablet (150 mg total) by mouth 2 (two) times daily. 08/14/21  Yes Ursula Alert, MD  MILK THISTLE PO Take by mouth.   Yes [provider]  Multiple Vitamin (MULTI-VITAMIN) tablet Take 1 tablet by mouth daily.   Yes [provider]  neomycin-polymyxin b-dexamethasone (MAXITROL) 3.5-10000-0.1 OINT SMARTSIG:1 sparingly Right Eye 3 Times Daily 07/25/21  Yes [provider]  omeprazole (PRILOSEC) 40 MG capsule TAKE 1 CAPSULE BY MOUTH EVERY DAY 10/22/15  Yes Chrismon, Vickki Muff, PA-C  SM CALCIUM 600/VITAMIN D 600-400 MG-UNIT tablet Take 1 tablet by mouth 2 (two) times daily. 05/01/19  Yes [provider]  SYNTHROID 150 MCG tablet Take 150 mcg by mouth daily. 10/10/19  Yes [provider]  traZODone (DESYREL) 100 MG tablet TAKE 1 AND 1/2 TO 2 TABLETS BY MOUTH AT BEDTIME AS  NEEDED FOR SLEEP 08/13/21  Yes Eappen, Ria Clock, MD  triamcinolone cream (KENALOG) 0.1 % TRIAMCINOLONE ACETONIDE, 0.1% (External  Cream)  1 (one) Cream Cream use as directed for 0 days  Quantity: 30;  Refills: 1   Ordered :12-Jan-2014  Meyer Cory ;  Started 12-Jan-2014 Active 01/12/14  Yes [provider]    Allergies as of 10/29/2021 - Review Complete 08/14/2021  Allergen Reaction Noted   Amantadine Other (See Comments) 10/22/2014   Benztropine  10/22/2014   Geodon  [ziprasidone hcl] Other (See Comments) 10/22/2014   Sulfa antibiotics  07/17/2010   Z-pak [azithromycin]  10/25/2019    Family History  Problem Relation Age of Onset   GER disease Father    Rheum arthritis Sister    Healthy Sister    Hypertension Mother     Mental illness Neg Hx     Social History   Socioeconomic History   Marital status: Married    Spouse name: Jamie Baxter   Number of children: 1   Years of education: Not on file   Highest education level: High school graduate  Occupational History   Not on file  Tobacco Use   Smoking status: Former    Types: Cigarettes    Quit date: 02/03/1976    Years since quitting: 45.9   Smokeless tobacco: Never  Vaping Use   Vaping Use: Never used  Substance and Sexual Activity   Alcohol use: No   Drug use: No   Sexual activity: Not Currently  Other Topics Concern   Not on file  Social History Narrative   Not on file   Social Determinants of Health   Financial Resource Strain: Low Risk  (08/11/2018)   Overall Financial Resource Strain (CARDIA)    Difficulty of Paying Living Expenses: Not hard at all  Recent Concern: Financial Resource Strain - High Risk (07/12/2018)   Overall Financial Resource Strain (CARDIA)    Difficulty of Paying Living Expenses: Very hard  Food Insecurity: No Food Insecurity (08/11/2018)   Hunger Vital Sign    Worried About Running Out of Food in the Last Year: Never true    [place] in the Last Year: Never true  Recent Concern: Food Insecurity - Food Insecurity Present (07/12/2018)   Hunger Vital Sign    Worried About Running Out of Food in the Last Year: Often true    Ran Out of Food in the Last Year: Often true  Transportation Needs: No Transportation Needs (07/12/2018)   PRAPARE - Hydrologist (Medical): No    Lack of Transportation (Non-Medical): No  Physical Activity: Insufficiently Active (07/12/2018)   Exercise Vital Sign    Days of Exercise per Week: 5 days    Minutes of Exercise per Session: 20 min  Stress: No Stress Concern Present (07/12/2018)   [place]    Feeling of Stress : Not at all  Social Connections: Unknown (07/12/2018)   Social Connection and  Isolation Panel [NHANES]    Frequency of Communication with Friends and Family: Not on file    Frequency of Social Gatherings with Friends and Family: Not on file    Attends Religious Services: More than 4 times per year    Active Member of [employer] or Organizations: No    Attends Archivist Meetings: Never    Marital Status: Married  Human resources officer Violence: Not At Risk (07/12/2018)   Humiliation, Afraid, Rape, and Kick questionnaire    Fear of Current or Ex-Partner: No    Emotionally Abused: No    Physically Abused:  No    Sexually Abused: No    Review of Systems: See HPI, otherwise negative ROS  Physical Exam: BP 125/82   Pulse 87   Temp 98.3 F (36.8 C) (Oral)   Resp 15   Ht '5\' 9"'$  (1.753 m)   Wt 92.3 kg   SpO2 96%   BMI 30.04 kg/m  General:   Alert, cooperative in NAD Head:  Normocephalic and atraumatic. Respiratory:  Normal work of breathing. Cardiovascular:  RRR  Impression/Plan: Jamie Baxter is here for cataract surgery.  Risks, benefits, limitations, and alternatives regarding cataract surgery have been reviewed with the patient.  Questions have been answered.  All parties agreeable.   Birder Robson, MD  12/23/2021, 12:56 PM

## 2021-12-23 NOTE — Transfer of Care (Signed)
Immediate Anesthesia Transfer of Care Note  Patient: Jamie Baxter  Procedure(s) Performed: CATARACT EXTRACTION PHACO AND INTRAOCULAR LENS PLACEMENT (IOC) LEFT  4.14  00:36.2 (Left: Eye)  Patient Location: PACU  Anesthesia Type: MAC  Level of Consciousness: awake, alert  and patient cooperative  Airway and Oxygen Therapy: Patient Spontanous Breathing and Patient connected to supplemental oxygen  Post-op Assessment: Post-op Vital signs reviewed, Patient's Cardiovascular Status Stable, Respiratory Function Stable, Patent Airway and No signs of Nausea or vomiting  Post-op Vital Signs: Reviewed and stable  Complications: No notable events documented.

## 2021-12-23 NOTE — Anesthesia Postprocedure Evaluation (Signed)
Anesthesia Post Note  Patient: Jamie Baxter  Procedure(s) Performed: CATARACT EXTRACTION PHACO AND INTRAOCULAR LENS PLACEMENT (IOC) LEFT  4.14  00:36.2 (Left: Eye)  Patient location during evaluation: PACU Anesthesia Type: MAC Level of consciousness: awake and alert Pain management: pain level controlled Vital Signs Assessment: post-procedure vital signs reviewed and stable Respiratory status: spontaneous breathing, nonlabored ventilation, respiratory function stable and patient connected to nasal cannula oxygen Cardiovascular status: stable and blood pressure returned to baseline Postop Assessment: no apparent nausea or vomiting Anesthetic complications: no  No notable events documented.   Last Vitals:  Vitals:   12/23/21 1328 12/23/21 1340  BP: 131/82 129/80  Pulse: 80 72  Resp: 16 (!) 21  Temp: (!) 36.1 C (!) 36.1 C  SpO2: 97% 98%    Last Pain:  Vitals:   12/23/21 1340  TempSrc:   PainSc: 0-No pain                 Dimas Millin

## 2021-12-23 NOTE — Anesthesia Preprocedure Evaluation (Addendum)
Anesthesia Evaluation  Patient identified by MRN, date of birth, ID band Patient awake    Reviewed: Allergy & Precautions, NPO status , Patient's Chart, lab work & pertinent test results  History of Anesthesia Complications Negative for: history of anesthetic complications  Airway Mallampati: III  TM Distance: >3 FB Neck ROM: full    Dental  (+) Chipped   Pulmonary neg pulmonary ROS, neg COPD, former smoker   Pulmonary exam normal        Cardiovascular (-) Past MI and (-) CABG negative cardio ROS Normal cardiovascular exam     Neuro/Psych  PSYCHIATRIC DISORDERS      negative neurological ROS     GI/Hepatic Neg liver ROS,GERD  ,,  Endo/Other  negative endocrine ROS    Renal/GU      Musculoskeletal   Abdominal   Peds  Hematology negative hematology ROS (+)   Anesthesia Other Findings Past Medical History: No date: Anxiety No date: Depression No date: GERD (gastroesophageal reflux disease) No date: Motion sickness     Comment:  cars - [place] No date: Thyroid disease  Past Surgical History: No date: ABSCESS DRAINAGE No date: BREAST BIOPSY No date: BREAST SURGERY     Comment:  breast biopsy 2006, 07/17/10: COLONOSCOPY     Comment:  Normal (except melanosis), ? hx polyps in 1990's -               [place] No date: rotator cuff 11/25/2021: ROTATOR CUFF REPAIR; Right     Comment:  [facility] 1978: THYROID LOBECTOMY     Comment:  goiter 2010: THYROIDECTOMY SUBSTERNAL     Comment:  left side/cancer/radioactive iodine 2 years in a row for              Ca cells No date: VAGINAL HYSTERECTOMY     Comment:  fiboid tumors  BMI    Body Mass Index: 30.04 kg/m      Reproductive/Obstetrics negative OB ROS                             Anesthesia Physical Anesthesia Plan  ASA: 2  Anesthesia Plan: MAC   Post-op Pain Management:    Induction: Intravenous  PONV Risk Score  and Plan: 2 and TIVA and Midazolam  Airway Management Planned: Natural Airway and Nasal Cannula  Additional Equipment:   Intra-op Plan:   Post-operative Plan:   Informed Consent: I have reviewed the patients History and Physical, chart, labs and discussed the procedure including the risks, benefits and alternatives for the proposed anesthesia with the patient or authorized representative who has indicated his/her understanding and acceptance.     Dental Advisory Given  Plan Discussed with: Anesthesiologist, CRNA and Surgeon  Anesthesia Plan Comments: (Patient consented for risks of anesthesia including but not limited to:  - adverse reactions to medications - damage to eyes, teeth, lips or other oral mucosa - nerve damage due to positioning  - sore throat or hoarseness - Damage to heart, brain, nerves, lungs, other parts of body or loss of life  Patient voiced understanding.)       Anesthesia Quick Evaluation

## 2021-12-24 ENCOUNTER — Encounter: Payer: Self-pay | Admitting: Ophthalmology

## 2021-12-30 ENCOUNTER — Encounter: Payer: Self-pay | Admitting: Ophthalmology

## 2021-12-30 DIAGNOSIS — M25512 Pain in left shoulder: Secondary | ICD-10-CM | POA: Diagnosis not present

## 2022-01-02 DIAGNOSIS — M25512 Pain in left shoulder: Secondary | ICD-10-CM | POA: Diagnosis not present

## 2022-01-05 DIAGNOSIS — K51919 Ulcerative colitis, unspecified with unspecified complications: Secondary | ICD-10-CM | POA: Diagnosis not present

## 2022-01-05 DIAGNOSIS — Z791 Long term (current) use of non-steroidal anti-inflammatories (NSAID): Secondary | ICD-10-CM | POA: Diagnosis not present

## 2022-01-05 DIAGNOSIS — M159 Polyosteoarthritis, unspecified: Secondary | ICD-10-CM | POA: Diagnosis not present

## 2022-01-05 DIAGNOSIS — M25512 Pain in left shoulder: Secondary | ICD-10-CM | POA: Diagnosis not present

## 2022-01-05 NOTE — Discharge Instructions (Signed)

## 2022-01-06 ENCOUNTER — Other Ambulatory Visit: Payer: Self-pay

## 2022-01-06 ENCOUNTER — Encounter
Admission: RE | Disposition: A | Discharge status: Home / Self Care | Payer: Self-pay | Source: Ambulatory Visit | Attending: Ophthalmology

## 2022-01-06 ENCOUNTER — Encounter: Payer: Self-pay | Admitting: Ophthalmology

## 2022-01-06 ENCOUNTER — Ambulatory Visit
Admission: RE | Admit: 2022-01-06 | Discharge: 2022-01-06 | Disposition: A | Discharge status: Home / Self Care | Payer: Medicare Other | Source: Ambulatory Visit | Attending: Ophthalmology | Admitting: Ophthalmology

## 2022-01-06 ENCOUNTER — Ambulatory Visit: Payer: Medicare Other | Admitting: Anesthesiology

## 2022-01-06 DIAGNOSIS — Z87891 Personal history of nicotine dependence: Secondary | ICD-10-CM | POA: Diagnosis not present

## 2022-01-06 DIAGNOSIS — H2511 Age-related nuclear cataract, right eye: Secondary | ICD-10-CM | POA: Diagnosis not present

## 2022-01-06 DIAGNOSIS — K219 Gastro-esophageal reflux disease without esophagitis: Secondary | ICD-10-CM | POA: Insufficient documentation

## 2022-01-06 DIAGNOSIS — F32A Depression, unspecified: Secondary | ICD-10-CM | POA: Insufficient documentation

## 2022-01-06 DIAGNOSIS — F419 Anxiety disorder, unspecified: Secondary | ICD-10-CM | POA: Diagnosis not present

## 2022-01-06 DIAGNOSIS — E079 Disorder of thyroid, unspecified: Secondary | ICD-10-CM | POA: Insufficient documentation

## 2022-01-06 HISTORY — PX: CATARACT EXTRACTION W/PHACO: SHX586

## 2022-01-06 SURGERY — PHACOEMULSIFICATION, CATARACT, WITH IOL INSERTION
Anesthesia: Monitor Anesthesia Care | Site: Eye | Laterality: Right

## 2022-01-06 MED ORDER — LACTATED RINGERS IV SOLN: INTRAVENOUS | Status: DC

## 2022-01-06 MED ORDER — TETRACAINE HCL 0.5 % OP SOLN
1.0000 [drp] | OPHTHALMIC | Status: DC | PRN
  Administered 2022-01-06 (×3): 1 [drp] via OPHTHALMIC

## 2022-01-06 MED ORDER — BRIMONIDINE TARTRATE-TIMOLOL 0.2-0.5 % OP SOLN
OPHTHALMIC | Status: DC | PRN
  Administered 2022-01-06: 1 [drp] via OPHTHALMIC

## 2022-01-06 MED ORDER — SIGHTPATH DOSE#1 BSS IO SOLN
INTRAOCULAR | Status: DC | PRN
  Administered 2022-01-06: 15 mL

## 2022-01-06 MED ORDER — FENTANYL CITRATE (PF) 100 MCG/2ML IJ SOLN
INTRAMUSCULAR | Status: DC | PRN
  Administered 2022-01-06: 50 ug via INTRAVENOUS

## 2022-01-06 MED ORDER — MOXIFLOXACIN HCL 0.5 % OP SOLN
OPHTHALMIC | Status: DC | PRN
  Administered 2022-01-06: .2 mL via OPHTHALMIC

## 2022-01-06 MED ORDER — SIGHTPATH DOSE#1 BSS IO SOLN
INTRAOCULAR | Status: DC | PRN
  Administered 2022-01-06: 1 mL via INTRAMUSCULAR

## 2022-01-06 MED ORDER — MIDAZOLAM HCL 2 MG/2ML IJ SOLN
INTRAMUSCULAR | Status: DC | PRN
  Administered 2022-01-06: 2 mg via INTRAVENOUS

## 2022-01-06 MED ORDER — SIGHTPATH DOSE#1 BSS IO SOLN
INTRAOCULAR | Status: DC | PRN
  Administered 2022-01-06: 68 mL via OPHTHALMIC

## 2022-01-06 MED ORDER — SIGHTPATH DOSE#1 NA CHONDROIT SULF-NA HYALURON 40-17 MG/ML IO SOLN
INTRAOCULAR | Status: DC | PRN
  Administered 2022-01-06: 1 mL via INTRAOCULAR

## 2022-01-06 MED ORDER — ARMC OPHTHALMIC DILATING DROPS
1.0000 | OPHTHALMIC | Status: DC | PRN
  Administered 2022-01-06 (×3): 1 via OPHTHALMIC

## 2022-01-06 SURGICAL SUPPLY — 9 items
CATARACT SUITE SIGHTPATH (MISCELLANEOUS) ×1 IMPLANT
FEE CATARACT SUITE SIGHTPATH (MISCELLANEOUS) ×2 IMPLANT
GLOVE SURG ENC TEXT LTX SZ8 (GLOVE) ×2 IMPLANT
GLOVE SURG TRIUMPH 8.0 PF LTX (GLOVE) ×2 IMPLANT
LENS IOL TECNIS EYHANCE 12.0 (Intraocular Lens) IMPLANT
NDL FILTER BLUNT 18X1 1/2 (NEEDLE) ×2 IMPLANT
NEEDLE FILTER BLUNT 18X1 1/2 (NEEDLE) ×1 IMPLANT
SYR 3ML LL SCALE MARK (SYRINGE) ×2 IMPLANT
WATER STERILE IRR 250ML POUR (IV SOLUTION) ×2 IMPLANT

## 2022-01-06 NOTE — H&P (Signed)
[facility]   Primary Care Physician:  Leonides Sake, MD Ophthalmologist: Dr. George Ina  Pre-Procedure History & Physical: HPI:  Jamie Baxter is a 71 y.o. female here for cataract surgery.   Past Medical History:  Diagnosis Date   Anxiety    Depression    GERD (gastroesophageal reflux disease)    Motion sickness    cars [place]   Thyroid disease     Past Surgical History:  Procedure Laterality Date   ABSCESS DRAINAGE     BREAST BIOPSY     BREAST SURGERY     breast biopsy   CATARACT EXTRACTION W/PHACO Left 12/23/2021   Procedure: CATARACT EXTRACTION PHACO AND INTRAOCULAR LENS PLACEMENT (IOC) LEFT  4.14  00:36.2;  Surgeon: Birder Robson, MD;  Location: [place];  Service: Ophthalmology;  Laterality: Left;   COLONOSCOPY  2006, 07/17/10   Normal (except melanosis), ? hx polyps in 1990's - [place]   rotator cuff     ROTATOR CUFF REPAIR Right 11/25/2021   [facility]   THYROID LOBECTOMY  1978   goiter   THYROIDECTOMY SUBSTERNAL  2010   left side/cancer/radioactive iodine 2 years in a row for Ca cells   VAGINAL HYSTERECTOMY     fiboid tumors    Prior to Admission medications   Medication Sig Start Date End Date Taking? Authorizing Provider  B Complex Vitamins (B COMPLEX PO) Take by mouth.   Yes [provider]  buPROPion (WELLBUTRIN) 75 MG tablet TAKE 1 TABLET BY MOUTH DAILY  WITH BREAKFAST 10/15/21  Yes Eappen, Ria Clock, MD  celecoxib (CELEBREX) 100 MG capsule Take 1 capsule by mouth 2 (two) times daily. 09/02/21  Yes [provider]  Coenzyme Q10 (COQ10 PO) Take by mouth.   Yes [provider]  FLUoxetine (PROZAC) 20 MG capsule Take 1 capsule (20 mg total) by mouth daily. Take along with 40 mg daily 11/05/21  Yes Eappen, Ria Clock, MD  FLUoxetine (PROZAC) 40 MG capsule Take 1 capsule (40 mg total) by mouth daily. Takes a 20 mg along with it 11/05/21  Yes Eappen, Saramma, MD  fluticasone (FLONASE) 50 MCG/ACT nasal spray  Place into both nostrils daily.   Yes [provider]  gabapentin (NEURONTIN) 100 MG capsule Take 1 capsule by mouth 2 (two) times daily. 09/02/21 09/02/22 Yes [provider]  Glucosamine-Chondroitin (GLUCOSAMINE CHONDR COMPLEX PO) Take by mouth daily.   Yes [provider]  hydrOXYzine (VISTARIL) 25 MG capsule TAKE 1 CAP BY MOUTH DAILY AS NEEDED TO BE TAKEN 1-3 TIMES A WEEK AS NEEDED FOR SEVERE PANIC ATTACKS 08/14/21  Yes Ursula Alert, MD  lamoTRIgine (LAMICTAL) 150 MG tablet Take 1 tablet (150 mg total) by mouth 2 (two) times daily. 08/14/21  Yes Ursula Alert, MD  MILK THISTLE PO Take by mouth.   Yes [provider]  Multiple Vitamin (MULTI-VITAMIN) tablet Take 1 tablet by mouth daily.   Yes [provider]  SM CALCIUM 600/VITAMIN D 600-400 MG-UNIT tablet Take 1 tablet by mouth 2 (two) times daily. 05/01/19  Yes [provider]  SYNTHROID 150 MCG tablet Take 150 mcg by mouth daily. 10/10/19  Yes [provider]  traZODone (DESYREL) 100 MG tablet TAKE 1 AND 1/2 TO 2 TABLETS BY MOUTH AT BEDTIME AS  NEEDED FOR SLEEP 08/13/21  Yes Eappen, Ria Clock, MD  acetaminophen (TYLENOL) 325 MG tablet Take by mouth.    [provider]  neomycin-polymyxin b-dexamethasone (MAXITROL) 3.5-10000-0.1 OINT SMARTSIG:1 sparingly Right Eye 3  Times Daily 07/25/21   [provider]  omeprazole (PRILOSEC) 40 MG capsule TAKE 1 CAPSULE BY MOUTH EVERY DAY 10/22/15   Chrismon, Vickki Muff, PA-C  triamcinolone cream (KENALOG) 0.1 % TRIAMCINOLONE ACETONIDE, 0.1% (External Cream)  1 (one) Cream Cream use as directed for 0 days  Quantity: 30;  Refills: 1   Ordered :12-Jan-2014  Meyer Cory ;  Started 12-Jan-2014 Active 01/12/14   [provider]    Allergies as of 10/29/2021 - Review Complete 08/14/2021  Allergen Reaction Noted   Amantadine Other (See Comments) 10/22/2014   Benztropine  10/22/2014   Geodon  [ziprasidone hcl] Other (See  Comments) 10/22/2014   Sulfa antibiotics  07/17/2010   Z-pak [azithromycin]  10/25/2019    Family History  Problem Relation Age of Onset   GER disease Father    Rheum arthritis Sister    Healthy Sister    Hypertension Mother    Mental illness Neg Hx     Social History   Socioeconomic History   Marital status: Married    Spouse name: monroe   Number of children: 1   Years of education: Not on file   Highest education level: High school graduate  Occupational History   Not on file  Tobacco Use   Smoking status: Former    Types: Cigarettes    Quit date: 02/03/1976    Years since quitting: 45.9   Smokeless tobacco: Never  Vaping Use   Vaping Use: Never used  Substance and Sexual Activity   Alcohol use: No   Drug use: No   Sexual activity: Not Currently  Other Topics Concern   Not on file  Social History Narrative   Not on file   Social Determinants of Health   Financial Resource Strain: Low Risk  (08/11/2018)   Overall Financial Resource Strain (CARDIA)    Difficulty of Paying Living Expenses: Not hard at all  Recent Concern: Financial Resource Strain - High Risk (07/12/2018)   Overall Financial Resource Strain (CARDIA)    Difficulty of Paying Living Expenses: Very hard  Food Insecurity: No Food Insecurity (08/11/2018)   Hunger Vital Sign    Worried About Running Out of Food in the Last Year: Never true    [place] in the Last Year: Never true  Recent Concern: Food Insecurity - Food Insecurity Present (07/12/2018)   Hunger Vital Sign    Worried About Running Out of Food in the Last Year: Often true    Ran Out of Food in the Last Year: Often true  Transportation Needs: No Transportation Needs (07/12/2018)   PRAPARE - Hydrologist (Medical): No    Lack of Transportation (Non-Medical): No  Physical Activity: Insufficiently Active (07/12/2018)   Exercise Vital Sign    Days of Exercise per Week: 5 days    Minutes of Exercise per Session: 20  min  Stress: No Stress Concern Present (07/12/2018)   [place]    Feeling of Stress : Not at all  Social Connections: Unknown (07/12/2018)   Social Connection and Isolation Panel [NHANES]    Frequency of Communication with Friends and Family: Not on file    Frequency of Social Gatherings with Friends and Family: Not on file    Attends Religious Services: More than 4 times per year    Active Member of [employer] or Organizations: No    Attends Archivist Meetings: Never    Marital  Status: Married  Human resources officer Violence: Not At Risk (07/12/2018)   Humiliation, Afraid, Rape, and Kick questionnaire    Fear of Current or Ex-Partner: No    Emotionally Abused: No    Physically Abused: No    Sexually Abused: No    Review of Systems: See HPI, otherwise negative ROS  Physical Exam: BP (!) 123/90   Pulse 82   Temp 98.2 F (36.8 C) (Temporal)   Resp (!) 21   Ht '5\' 7"'$  (1.702 m)   Wt 93.4 kg   SpO2 95%   BMI 32.26 kg/m  General:   Alert, cooperative in NAD Head:  Normocephalic and atraumatic. Respiratory:  Normal work of breathing. Cardiovascular:  RRR  Impression/Plan: Jamie Baxter is here for cataract surgery.  Risks, benefits, limitations, and alternatives regarding cataract surgery have been reviewed with the patient.  Questions have been answered.  All parties agreeable.   Birder Robson, MD  01/06/2022, 7:49 AM

## 2022-01-06 NOTE — Anesthesia Preprocedure Evaluation (Signed)
Anesthesia Evaluation  Patient identified by MRN, date of birth, ID band Patient awake    Reviewed: Allergy & Precautions, NPO status , Patient's Chart, lab work & pertinent test results  History of Anesthesia Complications Negative for: history of anesthetic complications  Airway Mallampati: III  TM Distance: >3 FB Neck ROM: full    Dental  (+) Chipped   Pulmonary neg pulmonary ROS, neg COPD, former smoker   Pulmonary exam normal        Cardiovascular (-) Past MI and (-) CABG negative cardio ROS Normal cardiovascular exam     Neuro/Psych  PSYCHIATRIC DISORDERS      negative neurological ROS     GI/Hepatic Neg liver ROS,GERD  ,,  Endo/Other  negative endocrine ROS    Renal/GU      Musculoskeletal   Abdominal   Peds  Hematology negative hematology ROS (+)   Anesthesia Other Findings Past Medical History: No date: Anxiety No date: Depression No date: GERD (gastroesophageal reflux disease) No date: Motion sickness     Comment:  [place] No date: Thyroid disease  Past Surgical History: No date: ABSCESS DRAINAGE No date: BREAST BIOPSY No date: BREAST SURGERY     Comment:  breast biopsy 2006, 07/17/10: COLONOSCOPY     Comment:  Normal (except melanosis), ? hx polyps in 1990's -               [place] No date: rotator cuff 11/25/2021: ROTATOR CUFF REPAIR; Right     Comment:  [facility] 1978: THYROID LOBECTOMY     Comment:  goiter 2010: THYROIDECTOMY SUBSTERNAL     Comment:  left side/cancer/radioactive iodine 2 years in a row for              Ca cells No date: VAGINAL HYSTERECTOMY     Comment:  fiboid tumors  BMI    Body Mass Index: 30.04 kg/m      Reproductive/Obstetrics negative OB ROS                              Anesthesia Physical Anesthesia Plan  ASA: 2  Anesthesia Plan: MAC   Post-op Pain Management: Minimal or no pain anticipated   Induction:  Intravenous  PONV Risk Score and Plan: 2 and TIVA and Midazolam  Airway Management Planned: Natural Airway and Nasal Cannula  Additional Equipment:   Intra-op Plan:   Post-operative Plan:   Informed Consent: I have reviewed the patients History and Physical, chart, labs and discussed the procedure including the risks, benefits and alternatives for the proposed anesthesia with the patient or authorized representative who has indicated his/her understanding and acceptance.     Dental Advisory Given  Plan Discussed with: Anesthesiologist, CRNA and Surgeon  Anesthesia Plan Comments: (Patient consented for risks of anesthesia including but not limited to:  - adverse reactions to medications - damage to eyes, teeth, lips or other oral mucosa - nerve damage due to positioning  - sore throat or hoarseness - Damage to heart, brain, nerves, lungs, other parts of body or loss of life  Patient voiced understanding.)        Anesthesia Quick Evaluation

## 2022-01-06 NOTE — Transfer of Care (Signed)
Immediate Anesthesia Transfer of Care Note  Patient: Jamie Baxter  Procedure(s) Performed: CATARACT EXTRACTION PHACO AND INTRAOCULAR LENS PLACEMENT (IOC) RIGHT  5.02  00:35.1 (Right: Eye)  Patient Location: PACU  Anesthesia Type: MAC  Level of Consciousness: awake, alert  and patient cooperative  Airway and Oxygen Therapy: Patient Spontanous Breathing and Patient connected to supplemental oxygen  Post-op Assessment: Post-op Vital signs reviewed, Patient's Cardiovascular Status Stable, Respiratory Function Stable, Patent Airway and No signs of Nausea or vomiting  Post-op Vital Signs: Reviewed and stable  Complications: There were no known notable events for this encounter.

## 2022-01-06 NOTE — Op Note (Signed)
PREOPERATIVE DIAGNOSIS:  Nuclear sclerotic cataract of the right eye.   POSTOPERATIVE DIAGNOSIS:  H25.11 Cataract   OPERATIVE PROCEDURE:ORPROCALL@   SURGEON:  Birder Robson, MD.   ANESTHESIA:  Anesthesiologist: Ilene Qua, MD CRNA: Patience Musca., CRNA  1.      Managed anesthesia care. 2.      0.89m of Shugarcaine was instilled in the eye following the paracentesis.   COMPLICATIONS:  None.   TECHNIQUE:   Stop and chop   DESCRIPTION OF PROCEDURE:  The patient was examined and consented in the preoperative holding area where the aforementioned topical anesthesia was applied to the right eye and then brought back to the Operating Room where the right eye was prepped and draped in the usual sterile ophthalmic fashion and a lid speculum was placed. A paracentesis was created with the side port blade and the anterior chamber was filled with viscoelastic. A near clear corneal incision was performed with the steel keratome. A continuous curvilinear capsulorrhexis was performed with a cystotome followed by the capsulorrhexis forceps. Hydrodissection and hydrodelineation were carried out with BSS on a blunt cannula. The lens was removed in a stop and chop  technique and the remaining cortical material was removed with the irrigation-aspiration handpiece. The capsular bag was inflated with viscoelastic and the Technis [id number]  lens was placed in the capsular bag without complication. The remaining viscoelastic was removed from the eye with the irrigation-aspiration handpiece. The wounds were hydrated. The anterior chamber was flushed with BSS and the eye was inflated to physiologic pressure. 0.111mof Vigamox was placed in the anterior chamber. The wounds were found to be water tight. The eye was dressed with Combigan. The patient was given protective glasses to wear throughout the day and a shield with which to sleep tonight. The patient was also given drops with which to begin a drop regimen  today and will follow-up with me in one day. Implant Name Type Inv. Item Serial No. Manufacturer Lot No. LRB No. Used Action  LENS IOL TECNIS EYHANCE 12.0 - [id number] Lens LENS IOL TECNIS EYHANCE 12.0 [id number]  Right 1 Implanted   Procedure(s): CATARACT EXTRACTION PHACO AND INTRAOCULAR LENS PLACEMENT (IOC) RIGHT  5.02  00:35.1 (Right)  Electronically signed: WiBirder Robson2/06/2021 8:14 AM

## 2022-01-06 NOTE — Anesthesia Postprocedure Evaluation (Signed)
Anesthesia Post Note  Patient: Jamie Baxter  Procedure(s) Performed: CATARACT EXTRACTION PHACO AND INTRAOCULAR LENS PLACEMENT (IOC) RIGHT  5.02  00:35.1 (Right: Eye)  Patient location during evaluation: PACU Anesthesia Type: MAC Level of consciousness: awake and alert Pain management: pain level controlled Vital Signs Assessment: post-procedure vital signs reviewed and stable Respiratory status: spontaneous breathing, nonlabored ventilation, respiratory function stable and patient connected to nasal cannula oxygen Cardiovascular status: stable and blood pressure returned to baseline Postop Assessment: no apparent nausea or vomiting Anesthetic complications: no   There were no known notable events for this encounter.   Last Vitals:  Vitals:   01/06/22 0656  BP: (!) 123/90  Pulse: 82  Resp: (!) 21  Temp: 36.8 C  SpO2: 95%    Last Pain:  Vitals:   01/06/22 0656  TempSrc: Temporal  PainSc: 0-No pain                 Ilene Qua

## 2022-01-07 ENCOUNTER — Encounter: Payer: Self-pay | Admitting: Ophthalmology

## 2022-01-14 DIAGNOSIS — M25512 Pain in left shoulder: Secondary | ICD-10-CM | POA: Diagnosis not present

## 2022-01-15 DIAGNOSIS — M25512 Pain in left shoulder: Secondary | ICD-10-CM | POA: Diagnosis not present

## 2022-01-23 DIAGNOSIS — M25512 Pain in left shoulder: Secondary | ICD-10-CM | POA: Diagnosis not present

## 2022-01-30 DIAGNOSIS — Z961 Presence of intraocular lens: Secondary | ICD-10-CM | POA: Diagnosis not present

## 2022-02-04 DIAGNOSIS — M25512 Pain in left shoulder: Secondary | ICD-10-CM | POA: Diagnosis not present

## 2022-02-06 DIAGNOSIS — M25512 Pain in left shoulder: Secondary | ICD-10-CM | POA: Diagnosis not present

## 2022-02-11 ENCOUNTER — Encounter: Payer: Self-pay | Admitting: Psychiatry

## 2022-02-11 ENCOUNTER — Ambulatory Visit (INDEPENDENT_AMBULATORY_CARE_PROVIDER_SITE_OTHER): Payer: Medicare HMO | Admitting: Psychiatry

## 2022-02-11 VITALS — BP 117/78 | HR 87 | Temp 98.3°F | Ht 67.0 in | Wt 205.6 lb

## 2022-02-11 DIAGNOSIS — R413 Other amnesia: Secondary | ICD-10-CM

## 2022-02-11 DIAGNOSIS — F410 Panic disorder [episodic paroxysmal anxiety] without agoraphobia: Secondary | ICD-10-CM | POA: Diagnosis not present

## 2022-02-11 DIAGNOSIS — F331 Major depressive disorder, recurrent, moderate: Secondary | ICD-10-CM

## 2022-02-11 DIAGNOSIS — Z9189 Other specified personal risk factors, not elsewhere classified: Secondary | ICD-10-CM

## 2022-02-11 DIAGNOSIS — G4701 Insomnia due to medical condition: Secondary | ICD-10-CM

## 2022-02-11 DIAGNOSIS — Z79899 Other long term (current) drug therapy: Secondary | ICD-10-CM | POA: Diagnosis not present

## 2022-02-11 DIAGNOSIS — M25512 Pain in left shoulder: Secondary | ICD-10-CM | POA: Diagnosis not present

## 2022-02-11 MED ORDER — QUETIAPINE FUMARATE 50 MG PO TABS: 50.0000 mg | ORAL_TABLET | Freq: Every day | ORAL | 1 refills | Status: DC

## 2022-02-11 NOTE — Patient Instructions (Signed)
Start taking Buproprion 75 mg every other day for 3 days and stop taking it.  Hold the trazodone or take a lower dosage of trazodone now that you are on the Seroquel.  Please get all the labs done.   Please call for EKG - [place]

## 2022-02-11 NOTE — Progress Notes (Unsigned)
[facility] MD OP Progress Note  02/11/2022 1:58 PM ELSY CHIANG  MRN:  [id number]  Chief Complaint:  Chief Complaint  Patient presents with   Follow-up   Anxiety   Depression   Medication Refill   Memory Loss   HPI: Jamie Baxter is a 72 year old Caucasian female, married, retired, lives in [place], has a history of MDD, panic disorder, osteoarthritis, history of thyroid cancer, GERD was evaluated in office today.  Patient today reports since the past 2 months or so she has been having worsening symptoms of sadness, zoning out, concentration problem, memory problems.  Patient reports she does not know what is going on wonders whether she has early dementia.  She may have had mild memory problems in the past however it is getting worse.  Patient interested in neurology evaluation.  Patient also reports sleep problems.  Reports some nights she sleeps okay and other nights she has been unable to sleep even with the 200 mg of trazodone.  She has been compliant on her medications otherwise.  Denies side effects.  Patient does report multiple psychosocial stressors.  Her sister is currently in hospice.  That is stressful for her.  Her sister might lose her home due to financial issues.  Patient reports she would like her sister to sell the home and pay it off.  She however reports her niece who is her sister's daughter does not seem to be doing that, and that worries her.  Patient currently denies any suicidality, homicidality or perceptual disturbances.  Patient was able to do an MMSE during the session, scored 30 out of 30.  Patient currently denies any physical symptoms of chest pain, shortness of breath ,GI symptoms, headaches, dizziness, fatigue, or urinary tract infection symptoms .Patient appeared to be alert, oriented to person place time situation.  Collateral information obtained from sister-Diane who reports that patient has been getting worse and worse with regards to her mood symptoms,  has observed her as zoned out a lot in the past few days.  She has been thinking about taking her to the nearest emergency department for an admission.    Visit Diagnosis:    ICD-10-CM   1. MDD (major depressive disorder), recurrent episode, moderate (HCC)  F33.1 Sodium    TSH    EKG 12-Lead    QUEtiapine (SEROQUEL) 50 MG tablet    2. Panic disorder  F41.0 Sodium    QUEtiapine (SEROQUEL) 50 MG tablet    3. Insomnia due to medical condition  G47.01 Sodium    TSH    QUEtiapine (SEROQUEL) 50 MG tablet   mood, pain    4. High risk medication use  Z79.899 Sodium    TSH    5. At risk for prolonged QT interval syndrome  Z91.89 EKG 12-Lead    QUEtiapine (SEROQUEL) 50 MG tablet    6. Memory impairment  R41.3       Past Psychiatric History: Reviewed past psychiatric history from progress note on 08/11/2018.  Past Medical History:  Past Medical History:  Diagnosis Date   Anxiety    Depression    GERD (gastroesophageal reflux disease)    Motion sickness    cars - [place]   Thyroid disease     Past Surgical History:  Procedure Laterality Date   ABSCESS DRAINAGE     BREAST BIOPSY     BREAST SURGERY     breast biopsy   CATARACT EXTRACTION W/PHACO Left 12/23/2021   Procedure: CATARACT EXTRACTION PHACO  AND INTRAOCULAR LENS PLACEMENT (IOC) LEFT  4.14  00:36.2;  Surgeon: Birder Robson, MD;  Location: [facility];  Service: Ophthalmology;  Laterality: Left;   CATARACT EXTRACTION W/PHACO Right 01/06/2022   Procedure: CATARACT EXTRACTION PHACO AND INTRAOCULAR LENS PLACEMENT (IOC) RIGHT  5.02  00:35.1;  Surgeon: Birder Robson, MD;  Location: [facility];  Service: Ophthalmology;  Laterality: Right;   COLONOSCOPY  2006, 07/17/10   Normal (except melanosis), ? hx polyps in 1990's - [place]   rotator cuff     ROTATOR CUFF REPAIR Right 11/25/2021    Ortho   THYROID LOBECTOMY  1978   goiter   THYROIDECTOMY SUBSTERNAL  2010   left  side/cancer/radioactive iodine 2 years in a row for Ca cells   VAGINAL HYSTERECTOMY     fiboid tumors    Family Psychiatric History: Reviewed family psychiatric history from progress note on 08/11/2018.  Family History:  Family History  Problem Relation Age of Onset   GER disease Father    Rheum arthritis Sister    Healthy Sister    Hypertension Mother    Mental illness Neg Hx     Social History: Reviewed social history from progress note on 08/11/2018. Social History   Socioeconomic History   Marital status: Married    Spouse name: monroe   Number of children: 1   Years of education: Not on file   Highest education level: High school graduate  Occupational History   Not on file  Tobacco Use   Smoking status: Former    Types: Cigarettes    Quit date: 02/03/1976    Years since quitting: 46.0   Smokeless tobacco: Never  Vaping Use   Vaping Use: Never used  Substance and Sexual Activity   Alcohol use: No   Drug use: No   Sexual activity: Not Currently  Other Topics Concern   Not on file  Social History Narrative   Not on file   Social Determinants of Health   Financial Resource Strain: Low Risk  (08/11/2018)   Overall Financial Resource Strain (CARDIA)    Difficulty of Paying Living Expenses: Not hard at all  Recent Concern: Financial Resource Strain - High Risk (07/12/2018)   Overall Financial Resource Strain (CARDIA)    Difficulty of Paying Living Expenses: Very hard  Food Insecurity: No Food Insecurity (08/11/2018)   Hunger Vital Sign    Worried About Running Out of Food in the Last Year: Never true    [place] in the Last Year: Never true  Recent Concern: Food Insecurity - Food Insecurity Present (07/12/2018)   Hunger Vital Sign    Worried About Running Out of Food in the Last Year: Often true    Ran Out of Food in the Last Year: Often true  Transportation Needs: No Transportation Needs (07/12/2018)   PRAPARE - Hydrologist (Medical):  No    Lack of Transportation (Non-Medical): No  Physical Activity: Insufficiently Active (07/12/2018)   Exercise Vital Sign    Days of Exercise per Week: 5 days    Minutes of Exercise per Session: 20 min  Stress: No Stress Concern Present (07/12/2018)   [place]    Feeling of Stress : Not at all  Social Connections: Unknown (07/12/2018)   Social Connection and Isolation Panel [NHANES]    Frequency of Communication with Friends and Family: Not on file    Frequency of Social Gatherings with  Friends and Family: Not on file    Attends Religious Services: More than 4 times per year    Active Member of Clubs or Organizations: No    Attends Archivist Meetings: Never    Marital Status: Married    Allergies:  Allergies  Allergen Reactions   Amantadine Other (See Comments)    disorientation   Benztropine    Geodon  [Ziprasidone Hcl] Other (See Comments)    disorientation   Sulfa Antibiotics Itching and Swelling    Topical only   Z-Pak [Azithromycin]     hallucinations    Metabolic Disorder Labs: Lab Results  Component Value Date   HGBA1C 5.7 (H) 09/19/2020   MPG 116.89 09/19/2020   Lab Results  Component Value Date   PROLACTIN 15.8 09/19/2020   Lab Results  Component Value Date   CHOL 235 (H) 11/28/2020   TRIG 94 11/28/2020   HDL 63 11/28/2020   CHOLHDL 3.7 11/28/2020   VLDL 19 11/28/2020   LDLCALC 153 (H) 11/28/2020   No results found for: "TSH"  Therapeutic Level Labs: No results found for: "LITHIUM" No results found for: "VALPROATE" No results found for: "CBMZ"  Current Medications: Current Outpatient Medications  Medication Sig Dispense Refill   acetaminophen (TYLENOL) 325 MG tablet Take by mouth.     B Complex Vitamins (B COMPLEX PO) Take by mouth.     celecoxib (CELEBREX) 100 MG capsule Take 1 capsule by mouth 2 (two) times daily.     Coenzyme Q10 (COQ10 PO) Take by mouth.      FLUoxetine (PROZAC) 20 MG capsule Take 1 capsule (20 mg total) by mouth daily. Take along with 40 mg daily 90 capsule 3   FLUoxetine (PROZAC) 40 MG capsule Take 1 capsule (40 mg total) by mouth daily. Takes a 20 mg along with it 90 capsule 3   fluticasone (FLONASE) 50 MCG/ACT nasal spray Place into both nostrils daily.     gabapentin (NEURONTIN) 100 MG capsule Take 1 capsule by mouth 2 (two) times daily.     Glucosamine-Chondroitin (GLUCOSAMINE CHONDR COMPLEX PO) Take by mouth daily.     hydrOXYzine (VISTARIL) 25 MG capsule TAKE 1 CAP BY MOUTH DAILY AS NEEDED TO BE TAKEN 1-3 TIMES A WEEK AS NEEDED FOR SEVERE PANIC ATTACKS 90 capsule 1   lamoTRIgine (LAMICTAL) 150 MG tablet Take 1 tablet (150 mg total) by mouth 2 (two) times daily. 180 tablet 3   MILK THISTLE PO Take by mouth.     Multiple Vitamin (MULTI-VITAMIN) tablet Take 1 tablet by mouth daily.     neomycin-polymyxin b-dexamethasone (MAXITROL) 3.5-10000-0.1 OINT SMARTSIG:1 sparingly Right Eye 3 Times Daily     omeprazole (PRILOSEC) 40 MG capsule TAKE 1 CAPSULE BY MOUTH EVERY DAY 90 capsule 0   QUEtiapine (SEROQUEL) 50 MG tablet Take 1 tablet (50 mg total) by mouth at bedtime. 30 tablet 1   SM CALCIUM 600/VITAMIN D 600-400 MG-UNIT tablet Take 1 tablet by mouth 2 (two) times daily.     SYNTHROID 150 MCG tablet Take 150 mcg by mouth daily.     traZODone (DESYREL) 100 MG tablet TAKE 1 AND 1/2 TO 2 TABLETS BY MOUTH AT BEDTIME AS  NEEDED FOR SLEEP 180 tablet 3   triamcinolone cream (KENALOG) 0.1 % TRIAMCINOLONE ACETONIDE, 0.1% (External Cream)  1 (one) Cream Cream use as directed for 0 days  Quantity: 30;  Refills: 1   Ordered :12-Jan-2014  Meyer Cory ;  Started 12-Jan-2014 Active  Current Facility-Administered Medications  Medication Dose Route Frequency Provider Last Rate Last Admin   0.9 %  sodium chloride infusion  500 mL Intravenous Continuous Gatha Mayer, MD         Musculoskeletal: Strength & Muscle Tone: within  normal limits Gait & Station: normal Patient leans: N/A  Psychiatric Specialty Exam: Review of Systems  Psychiatric/Behavioral:  Positive for decreased concentration, dysphoric mood and sleep disturbance. The patient is nervous/anxious.   All other systems reviewed and are negative.   Blood pressure 117/78, pulse 87, temperature 98.3 F (36.8 C), temperature source Oral, height '5\' 7"'$  (1.702 m), weight 205 lb 9.6 oz (93.3 kg).Body mass index is 32.2 kg/m.  General Appearance: Casual  Eye Contact:  Good  Speech:  Clear and Coherent  Volume:  Normal  Mood:  Anxious and Depressed  Affect:  Congruent  Thought Process:  Goal Directed and Descriptions of Associations: Intact  Orientation:  Full (Time, Place, and Person)  Thought Content: Logical   Suicidal Thoughts:  No  Homicidal Thoughts:  No  Memory:  Immediate;   Fair Recent;   Fair Remote;   Fair  Judgement:  Intact  Insight:  Good  Psychomotor Activity:  Normal  Concentration:  Concentration: Fair and Attention Span: Fair  Recall:  [employer] of Knowledge: Fair  Language: Fair  Akathisia:  No  Handed:  Right  AIMS (if indicated): not done  Assets:  Communication Skills Desire for Improvement Housing Social Support  ADL's:  Intact  Cognition: WNL  Sleep:  Poor   Screenings: [facility] Office Visit from 08/14/2021 in [facility] Office Visit from 05/23/2021 in [facility] Office Visit from 11/26/2020 in [facility] Visit from 08/20/2020 in [facility] Total Score 0 0 0 [facility] Visit from 02/11/2022 in [facility] Visit from 11/05/2021 in [facility] from 08/14/2021 in [facility] from 01/15/2021 in [facility] from 08/20/2020 in [facility]  Total GAD-7 Score 6 0 4 0 0      PHQ2-9    [place] Visit from 02/11/2022 in [facility] Visit from 11/05/2021 in [facility] Visit from 08/14/2021 in [facility] from 05/23/2021 in [facility] Visit from 03/05/2021 in [facility]  PHQ-2 Total Score 1 0 1 0 0  PHQ-9 Total Score '7 1 9 '$ 0 --      [facility] Office Visit from 02/11/2022 in [facility] Admission (Discharged) from 01/06/2022 in [facility] Admission (Discharged) from 12/23/2021 in [facility] No Risk No Risk No Risk        Assessment and Plan: ANAHIS FURGESON is a 72 year old Caucasian female who has a history of MDD, panic attacks, GERD, thyroid cancer in remission, osteoarthritis was evaluated in office today.  Patient with worsening memory problems, depression, anxiety, sleep problems, will benefit from the following plan.  Plan  MDD-unstable Continue Prozac 60 mg p.o. daily Continue lamotrigine 150 mg p.o. twice daily Taper off Wellbutrin, patient advised to take 75 mg every other day for the next 3 doses and stop taking it. Start Seroquel  50 mg p.o. nightly Discussed referral for CBT.  I have communicated with staff to place this patient on a wait list with our new therapist.  Patient in the meantime will reach out to her health insurance plan to make sure this is covered.  Patient advised to call the clinic back once she confirms.  Panic disorder-stable Continue Prozac as prescribed Hydroxyzine 25 mg as needed for severe anxiety attacks only.  Insomnia-unstable Added Seroquel as noted above Since Seroquel has been added advised patient to hold the  trazodone or take only a low dosage of half tablet of the 100 mg. Patient to monitor herself. Provided information about drug to drug interaction, provided education.  Memory impairment-unstable Patient had MMSE done in session today-scored 30 out of 30 Will order labs like TSH. Patient recently had vitamin B12 level done 11/07/2021-907. Will refer for neurological evaluation per patient request.  Discussed with patient that depression symptoms also could cause symptoms similar to dementia, likely pseudodementia. However patient will benefit from neurological evaluation.  High risk medication use-will order labs, TSH, sodium level since patient is on an SSRI.  Patient to go to [facility] lab.  At risk for prolonged QT syndrome-we will order EKG.  Patient to call [phone].  EKG needed since patient is being restarted on Seroquel.  Collateral information obtained from sister Diane as noted above.  Crisis plan discussed with patient.  Patient advised to call [phone] or go to the nearest emergency department if symptoms does not get better or is worsening.  Follow-up in clinic in 2 weeks or sooner if needed.  This note was generated in part or whole with voice recognition software. Voice recognition is usually quite accurate but there are transcription errors that can and very often do occur. I apologize for any typographical errors that were not detected and corrected.     Ursula Alert, MD 02/12/2022, 9:13 AM

## 2022-02-12 ENCOUNTER — Ambulatory Visit
Admission: RE | Admit: 2022-02-12 | Discharge: 2022-02-12 | Disposition: A | Discharge status: Home / Self Care | Payer: Medicare HMO | Source: Ambulatory Visit | Attending: Psychiatry | Admitting: Psychiatry

## 2022-02-12 ENCOUNTER — Other Ambulatory Visit
Admission: RE | Admit: 2022-02-12 | Discharge: 2022-02-12 | Disposition: A | Discharge status: Home / Self Care | Payer: Medicare HMO | Source: Ambulatory Visit | Attending: Psychiatry | Admitting: Psychiatry

## 2022-02-12 DIAGNOSIS — Z136 Encounter for screening for cardiovascular disorders: Secondary | ICD-10-CM | POA: Diagnosis not present

## 2022-02-12 DIAGNOSIS — Z79899 Other long term (current) drug therapy: Secondary | ICD-10-CM | POA: Diagnosis not present

## 2022-02-12 DIAGNOSIS — Z9189 Other specified personal risk factors, not elsewhere classified: Secondary | ICD-10-CM | POA: Insufficient documentation

## 2022-02-12 DIAGNOSIS — F331 Major depressive disorder, recurrent, moderate: Secondary | ICD-10-CM

## 2022-02-12 DIAGNOSIS — G4701 Insomnia due to medical condition: Secondary | ICD-10-CM

## 2022-02-12 DIAGNOSIS — F410 Panic disorder [episodic paroxysmal anxiety] without agoraphobia: Secondary | ICD-10-CM | POA: Insufficient documentation

## 2022-02-12 LAB — TSH: TSH: 1.601 u[IU]/mL (ref 0.350–4.500)

## 2022-02-12 LAB — SODIUM: Sodium: 137 mmol/L (ref 135–145)

## 2022-02-13 ENCOUNTER — Telehealth: Payer: Self-pay | Admitting: Psychiatry

## 2022-02-13 NOTE — Telephone Encounter (Signed)
Attempted to contact patient to discuss EKG as well as lab results, had to leave a voicemail.

## 2022-02-17 DIAGNOSIS — M25512 Pain in left shoulder: Secondary | ICD-10-CM | POA: Diagnosis not present

## 2022-02-20 DIAGNOSIS — M25512 Pain in left shoulder: Secondary | ICD-10-CM | POA: Diagnosis not present

## 2022-02-24 DIAGNOSIS — M25512 Pain in left shoulder: Secondary | ICD-10-CM | POA: Diagnosis not present

## 2022-02-27 DIAGNOSIS — M25512 Pain in left shoulder: Secondary | ICD-10-CM | POA: Diagnosis not present

## 2022-03-03 DIAGNOSIS — M25512 Pain in left shoulder: Secondary | ICD-10-CM | POA: Diagnosis not present

## 2022-03-06 DIAGNOSIS — M25512 Pain in left shoulder: Secondary | ICD-10-CM | POA: Diagnosis not present

## 2022-03-08 ENCOUNTER — Other Ambulatory Visit: Payer: Self-pay | Admitting: Psychiatry

## 2022-03-08 DIAGNOSIS — G4701 Insomnia due to medical condition: Secondary | ICD-10-CM

## 2022-03-08 DIAGNOSIS — F410 Panic disorder [episodic paroxysmal anxiety] without agoraphobia: Secondary | ICD-10-CM

## 2022-03-08 DIAGNOSIS — F331 Major depressive disorder, recurrent, moderate: Secondary | ICD-10-CM

## 2022-03-08 DIAGNOSIS — Z9189 Other specified personal risk factors, not elsewhere classified: Secondary | ICD-10-CM

## 2022-03-09 ENCOUNTER — Encounter: Payer: Self-pay | Admitting: Psychiatry

## 2022-03-09 ENCOUNTER — Ambulatory Visit (INDEPENDENT_AMBULATORY_CARE_PROVIDER_SITE_OTHER): Payer: Medicare HMO | Admitting: Psychiatry

## 2022-03-09 VITALS — BP 108/64 | HR 83 | Ht 66.5 in | Wt 205.0 lb

## 2022-03-09 DIAGNOSIS — G4701 Insomnia due to medical condition: Secondary | ICD-10-CM | POA: Diagnosis not present

## 2022-03-09 DIAGNOSIS — Z79899 Other long term (current) drug therapy: Secondary | ICD-10-CM | POA: Diagnosis not present

## 2022-03-09 DIAGNOSIS — F3341 Major depressive disorder, recurrent, in partial remission: Secondary | ICD-10-CM

## 2022-03-09 DIAGNOSIS — R413 Other amnesia: Secondary | ICD-10-CM | POA: Diagnosis not present

## 2022-03-09 DIAGNOSIS — M25512 Pain in left shoulder: Secondary | ICD-10-CM | POA: Diagnosis not present

## 2022-03-09 DIAGNOSIS — F410 Panic disorder [episodic paroxysmal anxiety] without agoraphobia: Secondary | ICD-10-CM

## 2022-03-09 NOTE — Progress Notes (Unsigned)
[facility] OP Progress Note  03/09/2022 3:40 PM Jamie Baxter  MRN:  [id number]  Chief Complaint:  Chief Complaint  Patient presents with   Follow-up   Medication Refill   Depression   Anxiety   Insomnia   HPI: Jamie Baxter is a 72 year old Caucasian female, married, retired, lives in [place], has a history of MDD, panic disorder, osteoarthritis, history of thyroid cancer, GERD was evaluated in office today.  Patient today reports she is currently feeling much better compared to how she was doing last visit.  Reports she does not have any more zoning out episodes.  Memory seems to have improved.  Denies any significant short-term memory issues at this time.  Patient reports she is sleeping better.  Patient reports she takes half a tablet of the trazodone as well as takes her Seroquel at night and that has been beneficial.  Does report increased appetite especially at the end of the day.  Reports she is trying to be mindful about what she eats.  Aware of side effects of medications like Seroquel being weight gain.  Reports she has been trying to stay away from an old since that relationship has been extremely stressful on her.  She reports her family is currently supportive of her doing so and that has definitely helped.  Patient denies any suicidality, homicidality or perceptual disturbances.  Patient appeared to be alert, oriented to person place time situation.  3 word memory immediate 3 out of 3, after 5 minutes 3 out of 3.  Attention and focus seem to be good in session today.  Was able to do serial sevens.  Patient denies any other concerns today.  Visit Diagnosis:    ICD-10-CM   1. Recurrent major depressive disorder, in partial remission ([place])  F33.41     2. Panic disorder  F41.0     3. Insomnia due to medical condition  G47.01    mood, pain      Past Psychiatric History: Reviewed past psychiatric history from progress note on 08/11/2018.  Past Medical History:  Past Medical  History:  Diagnosis Date   Anxiety    Depression    GERD (gastroesophageal reflux disease)    Motion sickness    cars - [place]   Thyroid disease     Past Surgical History:  Procedure Laterality Date   ABSCESS DRAINAGE     BREAST BIOPSY     BREAST SURGERY     breast biopsy   CATARACT EXTRACTION W/PHACO Left 12/23/2021   Procedure: CATARACT EXTRACTION PHACO AND INTRAOCULAR LENS PLACEMENT (IOC) LEFT  4.14  00:36.2;  Surgeon: Birder Robson, MD;  Location: [facility];  Service: Ophthalmology;  Laterality: Left;   CATARACT EXTRACTION W/PHACO Right 01/06/2022   Procedure: CATARACT EXTRACTION PHACO AND INTRAOCULAR LENS PLACEMENT (IOC) RIGHT  5.02  00:35.1;  Surgeon: Birder Robson, MD;  Location: [facility];  Service: Ophthalmology;  Laterality: Right;   COLONOSCOPY  2006, 07/17/10   Normal (except melanosis), ? hx polyps in 1990's - [place]   rotator cuff     ROTATOR CUFF REPAIR Right 11/25/2021   [facility]   THYROID LOBECTOMY  1978   goiter   THYROIDECTOMY SUBSTERNAL  2010   left side/cancer/radioactive iodine 2 years in a row for Ca cells   VAGINAL HYSTERECTOMY     fiboid tumors    Family Psychiatric History: Reviewed family psychiatric history from progress note on 08/11/2018.  Family History:  Family History  Problem  Relation Age of Onset   GER disease Father    Rheum arthritis Sister    Healthy Sister    Hypertension Mother    Mental illness Neg Hx     Social History: Reviewed social history from progress note on 08/11/2018. Social History   Socioeconomic History   Marital status: Married    Spouse name: monroe   Number of children: 1   Years of education: Not on file   Highest education level: High school graduate  Occupational History   Not on file  Tobacco Use   Smoking status: Former    Types: Cigarettes    Quit date: 02/03/1976    Years since quitting: 46.1   Smokeless tobacco: Never  Vaping Use   Vaping Use: Never used   Substance and Sexual Activity   Alcohol use: No   Drug use: No   Sexual activity: Not Currently  Other Topics Concern   Not on file  Social History Narrative   Not on file   Social Determinants of Health   Financial Resource Strain: Low Risk  (08/11/2018)   Overall Financial Resource Strain (CARDIA)    Difficulty of Paying Living Expenses: Not hard at all  Recent Concern: Financial Resource Strain - High Risk (07/12/2018)   Overall Financial Resource Strain (CARDIA)    Difficulty of Paying Living Expenses: Very hard  Food Insecurity: No Food Insecurity (08/11/2018)   Hunger Vital Sign    Worried About Running Out of Food in the Last Year: Never true    [place] in the Last Year: Never true  Recent Concern: Food Insecurity - Food Insecurity Present (07/12/2018)   Hunger Vital Sign    Worried About Running Out of Food in the Last Year: Often true    Ran Out of Food in the Last Year: Often true  Transportation Needs: No Transportation Needs (07/12/2018)   PRAPARE - Hydrologist (Medical): No    Lack of Transportation (Non-Medical): No  Physical Activity: Insufficiently Active (07/12/2018)   Exercise Vital Sign    Days of Exercise per Week: 5 days    Minutes of Exercise per Session: 20 min  Stress: No Stress Concern Present (07/12/2018)   [place]    Feeling of Stress : Not at all  Social Connections: Unknown (07/12/2018)   Social Connection and Isolation Panel [NHANES]    Frequency of Communication with Friends and Family: Not on file    Frequency of Social Gatherings with Friends and Family: Not on file    Attends Religious Services: More than 4 times per year    Active Member of [employer] or Organizations: No    Attends Archivist Meetings: Never    Marital Status: Married    Allergies:  Allergies  Allergen Reactions   Amantadine Other (See Comments)    disorientation    Benztropine    Geodon  [Ziprasidone Hcl] Other (See Comments)    disorientation   Sulfa Antibiotics Itching and Swelling    Topical only   Z-Pak [Azithromycin]     hallucinations    Metabolic Disorder Labs: Lab Results  Component Value Date   HGBA1C 5.7 (H) 09/19/2020   MPG 116.89 09/19/2020   Lab Results  Component Value Date   PROLACTIN 15.8 09/19/2020   Lab Results  Component Value Date   CHOL 235 (H) 11/28/2020   TRIG 94 11/28/2020   HDL 63 11/28/2020  CHOLHDL 3.7 11/28/2020   VLDL 19 11/28/2020   LDLCALC 153 (H) 11/28/2020   Lab Results  Component Value Date   TSH 1.601 02/12/2022    Therapeutic Level Labs: No results found for: "LITHIUM" No results found for: "VALPROATE" No results found for: "CBMZ"  Current Medications: Current Outpatient Medications  Medication Sig Dispense Refill   acetaminophen (TYLENOL) 325 MG tablet Take by mouth.     B Complex Vitamins (B COMPLEX PO) Take by mouth.     celecoxib (CELEBREX) 100 MG capsule Take 1 capsule by mouth 2 (two) times daily.     Coenzyme Q10 (COQ10 PO) Take by mouth.     FLUoxetine (PROZAC) 20 MG capsule Take 1 capsule (20 mg total) by mouth daily. Take along with 40 mg daily 90 capsule 3   FLUoxetine (PROZAC) 40 MG capsule Take 1 capsule (40 mg total) by mouth daily. Takes a 20 mg along with it 90 capsule 3   fluticasone (FLONASE) 50 MCG/ACT nasal spray Place into both nostrils daily.     gabapentin (NEURONTIN) 100 MG capsule Take 1 capsule by mouth 2 (two) times daily.     Glucosamine-Chondroitin (GLUCOSAMINE CHONDR COMPLEX PO) Take by mouth daily.     hydrOXYzine (VISTARIL) 25 MG capsule TAKE 1 CAP BY MOUTH DAILY AS NEEDED TO BE TAKEN 1-3 TIMES A WEEK AS NEEDED FOR SEVERE PANIC ATTACKS 90 capsule 1   lamoTRIgine (LAMICTAL) 150 MG tablet Take 1 tablet (150 mg total) by mouth 2 (two) times daily. 180 tablet 3   MILK THISTLE PO Take by mouth.     Multiple Vitamin (MULTI-VITAMIN) tablet Take 1 tablet by mouth  daily.     omeprazole (PRILOSEC) 40 MG capsule TAKE 1 CAPSULE BY MOUTH EVERY DAY 90 capsule 0   SM CALCIUM 600/VITAMIN D 600-400 MG-UNIT tablet Take 1 tablet by mouth 2 (two) times daily.     SYNTHROID 150 MCG tablet Take 150 mcg by mouth daily.     traZODone (DESYREL) 100 MG tablet TAKE 1 AND 1/2 TO 2 TABLETS BY MOUTH AT BEDTIME AS  NEEDED FOR SLEEP 180 tablet 3   triamcinolone cream (KENALOG) 0.1 % TRIAMCINOLONE ACETONIDE, 0.1% (External Cream)  1 (one) Cream Cream use as directed for 0 days  Quantity: 30;  Refills: 1   Ordered :12-Jan-2014  Meyer Cory ;  Started 12-Jan-2014 Active     neomycin-polymyxin b-dexamethasone (MAXITROL) 3.5-10000-0.1 OINT SMARTSIG:1 sparingly Right Eye 3 Times Daily (Patient not taking: Reported on 03/09/2022)     QUEtiapine (SEROQUEL) 50 MG tablet TAKE 1 TABLET BY MOUTH EVERYDAY AT BEDTIME 90 tablet 1   Current Facility-Administered Medications  Medication Dose Route Frequency Provider Last Rate Last Admin   0.9 %  sodium chloride infusion  500 mL Intravenous Continuous Gatha Mayer, MD         Musculoskeletal: Strength & Muscle Tone: within normal limits Gait & Station: normal Patient leans: N/A  Psychiatric Specialty Exam: Review of Systems  Psychiatric/Behavioral:  Positive for dysphoric mood. The patient is nervous/anxious.   All other systems reviewed and are negative.   Blood pressure 108/64, pulse 83, height 5' 6.5" (1.689 m), weight 205 lb (93 kg), SpO2 99 %.Body mass index is 32.59 kg/m.  General Appearance: Casual  Eye Contact:  Fair  Speech:  Clear and Coherent  Volume:  Normal  Mood:  Anxious and Depressed improving  Affect:  Congruent  Thought Process:  Goal Directed and Descriptions of Associations: Intact  Orientation:  Full (  Time, Place, and Person)  Thought Content: Logical   Suicidal Thoughts:  No  Homicidal Thoughts:  No  Memory:  Immediate;   Fair Recent;   Fair Remote;   Fair  Judgement:  Fair  Insight:  Fair   Psychomotor Activity:  Normal  Concentration:  Concentration: Fair and Attention Span: Fair  Recall:  [employer] of Knowledge: Fair  Language: Fair  Akathisia:  No  Handed:  Right  AIMS (if indicated): done  Assets:  Communication Skills Desire for Improvement Housing Intimacy Social Support  ADL's:  Intact  Cognition: WNL  Sleep:  Good   Screenings: Administrator, Civil Service Office Visit from 08/14/2021 in [facility] Office Visit from 05/23/2021 in [facility] Office Visit from 11/26/2020 in [facility] Office Visit from 08/20/2020 in [facility] Total Score 0 0 0 0      [facility] Office Visit from 03/09/2022 in [facility] Office Visit from 02/11/2022 in [facility] Office Visit from 11/05/2021 in [facility] Office Visit from 08/14/2021 in [facility] Office Visit from 01/15/2021 in [facility]  Total GAD-7 Score 2 6 0 4 0      PHQ2-9    [facility] Office Visit from 03/09/2022 in [facility] Office Visit from 02/11/2022 in [facility] Office Visit from 11/05/2021 in [facility] Office Visit from 08/14/2021 in [facility] Office Visit from 05/23/2021 in [facility]  PHQ-2 Total Score 3 1 0 1 0  PHQ-9 Total Score '6 7 1 9 '$ 0      Flow[facility] [place] Office Visit from 03/09/2022 in [facility] Office Visit from 02/11/2022 in [facility]  Admission (Discharged) from 01/06/2022 in [facility] No Risk No Risk No Risk        Assessment and Plan: Jamie Baxter is a 72 year old Caucasian female who has a history of MDD, panic attack, GERD, thyroid cancer in remission, osteoarthritis was evaluated in office today.  Patient is currently improving with regards to her mood as well as memory problems, will benefit from following plan.  Plan  MDD in partial remission Prozac 60 mg p.o. daily Lamotrigine 150 mg p.o. twice daily. Seroquel 50 mg p.o. nightly Patient was placed on therapy wait list, for CBT, pending  Panic disorder-stable Prozac as prescribed Hydroxyzine 25 mg as needed for severe anxiety attacks only  Insomnia-stable Seroquel 50 mg p.o. nightly Trazodone 100 mg-take 1-1/2 to 2 tablets at bedtime as needed.  Patient reports she is currently taking 1/2 tablet of the 100 mg - 50 mg.  Memory impairment-improving Patient reports memory issues have improved. TSH labs-02/12/2022-within normal limits. Patient was referred for neurological evaluation-however per neurologist patient to be referred back as needed after depression symptoms have improved.  This was discussed with patient, patient agreeable with plan.  High risk medication use-reviewed and discussed TSH, sodium level-02/12/2022-within normal limits.  Follow-up in clinic in 2 months or sooner if needed. This note was generated in part or whole with voice recognition software. Voice recognition is usually quite accurate  but there are transcription errors that can and very often do occur. I apologize for any typographical errors that were not detected and corrected.     Ursula Alert, MD 03/09/2022, 3:40 PM

## 2022-03-12 DIAGNOSIS — M25512 Pain in left shoulder: Secondary | ICD-10-CM | POA: Diagnosis not present

## 2022-03-16 DIAGNOSIS — Z961 Presence of intraocular lens: Secondary | ICD-10-CM | POA: Diagnosis not present

## 2022-03-16 DIAGNOSIS — M25512 Pain in left shoulder: Secondary | ICD-10-CM | POA: Diagnosis not present

## 2022-03-19 DIAGNOSIS — E7800 Pure hypercholesterolemia, unspecified: Secondary | ICD-10-CM | POA: Diagnosis not present

## 2022-03-19 DIAGNOSIS — C73 Malignant neoplasm of thyroid gland: Secondary | ICD-10-CM | POA: Diagnosis not present

## 2022-03-19 DIAGNOSIS — E890 Postprocedural hypothyroidism: Secondary | ICD-10-CM | POA: Diagnosis not present

## 2022-03-19 DIAGNOSIS — K219 Gastro-esophageal reflux disease without esophagitis: Secondary | ICD-10-CM | POA: Diagnosis not present

## 2022-03-20 DIAGNOSIS — M25512 Pain in left shoulder: Secondary | ICD-10-CM | POA: Diagnosis not present

## 2022-03-24 DIAGNOSIS — M25512 Pain in left shoulder: Secondary | ICD-10-CM | POA: Diagnosis not present

## 2022-03-27 DIAGNOSIS — M25512 Pain in left shoulder: Secondary | ICD-10-CM | POA: Diagnosis not present

## 2022-03-30 DIAGNOSIS — M25512 Pain in left shoulder: Secondary | ICD-10-CM | POA: Diagnosis not present

## 2022-03-31 DIAGNOSIS — C73 Malignant neoplasm of thyroid gland: Secondary | ICD-10-CM | POA: Diagnosis not present

## 2022-03-31 DIAGNOSIS — Z6831 Body mass index (BMI) 31.0-31.9, adult: Secondary | ICD-10-CM | POA: Diagnosis not present

## 2022-03-31 DIAGNOSIS — K219 Gastro-esophageal reflux disease without esophagitis: Secondary | ICD-10-CM | POA: Diagnosis not present

## 2022-03-31 DIAGNOSIS — F418 Other specified anxiety disorders: Secondary | ICD-10-CM | POA: Diagnosis not present

## 2022-03-31 DIAGNOSIS — E7800 Pure hypercholesterolemia, unspecified: Secondary | ICD-10-CM | POA: Diagnosis not present

## 2022-03-31 DIAGNOSIS — G4700 Insomnia, unspecified: Secondary | ICD-10-CM | POA: Diagnosis not present

## 2022-03-31 DIAGNOSIS — E6609 Other obesity due to excess calories: Secondary | ICD-10-CM | POA: Diagnosis not present

## 2022-03-31 DIAGNOSIS — E890 Postprocedural hypothyroidism: Secondary | ICD-10-CM | POA: Diagnosis not present

## 2022-03-31 DIAGNOSIS — N289 Disorder of kidney and ureter, unspecified: Secondary | ICD-10-CM | POA: Diagnosis not present

## 2022-04-03 DIAGNOSIS — M25512 Pain in left shoulder: Secondary | ICD-10-CM | POA: Diagnosis not present

## 2022-04-06 DIAGNOSIS — M25512 Pain in left shoulder: Secondary | ICD-10-CM | POA: Diagnosis not present

## 2022-04-08 DIAGNOSIS — M25512 Pain in left shoulder: Secondary | ICD-10-CM | POA: Diagnosis not present

## 2022-04-14 DIAGNOSIS — M25512 Pain in left shoulder: Secondary | ICD-10-CM | POA: Diagnosis not present

## 2022-04-17 DIAGNOSIS — M25512 Pain in left shoulder: Secondary | ICD-10-CM | POA: Diagnosis not present

## 2022-04-17 DIAGNOSIS — M75122 Complete rotator cuff tear or rupture of left shoulder, not specified as traumatic: Secondary | ICD-10-CM | POA: Diagnosis not present

## 2022-04-20 DIAGNOSIS — M25512 Pain in left shoulder: Secondary | ICD-10-CM | POA: Diagnosis not present

## 2022-04-23 DIAGNOSIS — M25512 Pain in left shoulder: Secondary | ICD-10-CM | POA: Diagnosis not present

## 2022-04-29 DIAGNOSIS — M25512 Pain in left shoulder: Secondary | ICD-10-CM | POA: Diagnosis not present

## 2022-04-30 DIAGNOSIS — M25512 Pain in left shoulder: Secondary | ICD-10-CM | POA: Diagnosis not present

## 2022-05-04 DIAGNOSIS — M25512 Pain in left shoulder: Secondary | ICD-10-CM | POA: Diagnosis not present

## 2022-05-08 ENCOUNTER — Ambulatory Visit (INDEPENDENT_AMBULATORY_CARE_PROVIDER_SITE_OTHER): Payer: Medicare HMO | Admitting: Psychiatry

## 2022-05-08 ENCOUNTER — Encounter: Payer: Self-pay | Admitting: Psychiatry

## 2022-05-08 VITALS — BP 113/70 | HR 96 | Temp 97.0°F | Ht 66.5 in | Wt 204.8 lb

## 2022-05-08 DIAGNOSIS — R413 Other amnesia: Secondary | ICD-10-CM

## 2022-05-08 DIAGNOSIS — F410 Panic disorder [episodic paroxysmal anxiety] without agoraphobia: Secondary | ICD-10-CM

## 2022-05-08 DIAGNOSIS — G4701 Insomnia due to medical condition: Secondary | ICD-10-CM

## 2022-05-08 DIAGNOSIS — F3342 Major depressive disorder, recurrent, in full remission: Secondary | ICD-10-CM | POA: Diagnosis not present

## 2022-05-08 DIAGNOSIS — M25512 Pain in left shoulder: Secondary | ICD-10-CM | POA: Diagnosis not present

## 2022-05-08 NOTE — Progress Notes (Signed)
BH MD OP Progress Note  05/08/2022 12:49 PM Jamie Baxter  MRN:  [id number]  Chief Complaint:  Chief Complaint  Patient presents with   Follow-up   Anxiety   Medication Refill   HPI: Jamie Baxter is a 72 year old Caucasian female, married, retired, lives in [place], has a history of MDD, panic disorder, osteoarthritis, history of thyroid cancer, GERD was evaluated in office today.  Patient today reports she continues to struggle with the relationship struggles she had with a friend who was harassing her.  She however has set boundaries and is trying to stay away from her although she does continue to have intrusive memories and gets fearful when she thinks about her.  Patient reports she is motivated to start therapy currently on a wait list.  Patient however otherwise overall reports mood symptoms as improving.  Patient denies any suicidality, homicidality or perceptual disturbances.  Reports sleep is good.  Patient is currently compliant on all her medications.  Denies side effects.  Her pharmacy has changed, however she will check and let writer know if she is due for refills.  Patient denies any other concerns today.  Visit Diagnosis:    ICD-10-CM   1. MDD (major depressive disorder), recurrent, in full remission  F33.42     2. Panic disorder  F41.0     3. Insomnia due to medical condition  G47.01    mood, pain    4. Memory impairment  R41.3       Past Psychiatric History: I have reviewed past psychiatric history from progress note on 08/11/2018.  Past Medical History:  Past Medical History:  Diagnosis Date   Anxiety    Depression    GERD (gastroesophageal reflux disease)    Motion sickness    cars - [place]   Thyroid disease     Past Surgical History:  Procedure Laterality Date   ABSCESS DRAINAGE     BREAST BIOPSY     BREAST SURGERY     breast biopsy   CATARACT EXTRACTION W/PHACO Left 12/23/2021   Procedure: CATARACT EXTRACTION PHACO AND  INTRAOCULAR LENS PLACEMENT (IOC) LEFT  4.14  00:36.2;  Surgeon: Galen Manila, MD;  Location: [facility];  Service: Ophthalmology;  Laterality: Left;   CATARACT EXTRACTION W/PHACO Right 01/06/2022   Procedure: CATARACT EXTRACTION PHACO AND INTRAOCULAR LENS PLACEMENT (IOC) RIGHT  5.02  00:35.1;  Surgeon: Galen Manila, MD;  Location: [facility];  Service: Ophthalmology;  Laterality: Right;   COLONOSCOPY  2006, 07/17/10   Normal (except melanosis), ? hx polyps in 1990's - [place]   rotator cuff     ROTATOR CUFF REPAIR Right 11/25/2021   [facility]   THYROID LOBECTOMY  1978   goiter   THYROIDECTOMY SUBSTERNAL  2010   left side/cancer/radioactive iodine 2 years in a row for Ca cells   VAGINAL HYSTERECTOMY     fiboid tumors    Family Psychiatric History: Reviewed family psychiatric history from progress note on 08/11/2018.  Family History:  Family History  Problem Relation Age of Onset   GER disease Father    Rheum arthritis Sister    Healthy Sister    Hypertension Mother    Mental illness Neg Hx     Social History: Reviewed social history from progress note on 08/11/2018. Social History   Socioeconomic History   Marital status: Married    Spouse name: monroe   Number of children: 1   Years of education: Not on file  Highest education level: High school graduate  Occupational History   Not on file  Tobacco Use   Smoking status: Former    Types: Cigarettes    Quit date: 02/03/1976    Years since quitting: 46.2   Smokeless tobacco: Never  Vaping Use   Vaping Use: Never used  Substance and Sexual Activity   Alcohol use: No   Drug use: No   Sexual activity: Not Currently  Other Topics Concern   Not on file  Social History Narrative   Not on file   Social Determinants of Health   Financial Resource Strain: Low Risk  (08/11/2018)   Overall Financial Resource Strain (CARDIA)    Difficulty of Paying Living Expenses: Not hard at all  Recent Concern:  Financial Resource Strain - High Risk (07/12/2018)   Overall Financial Resource Strain (CARDIA)    Difficulty of Paying Living Expenses: Very hard  Food Insecurity: No Food Insecurity (08/11/2018)   Hunger Vital Sign    Worried About Running Out of Food in the Last Year: Never true    Ran Out of Food in the Last Year: Never true  Recent Concern: Food Insecurity - Food Insecurity Present (07/12/2018)   Hunger Vital Sign    Worried About Running Out of Food in the Last Year: Often true    Ran Out of Food in the Last Year: Often true  Transportation Needs: No Transportation Needs (07/12/2018)   PRAPARE - Administrator, Civil Service (Medical): No    Lack of Transportation (Non-Medical): No  Physical Activity: Insufficiently Active (07/12/2018)   Exercise Vital Sign    Days of Exercise per Week: 5 days    Minutes of Exercise per Session: 20 min  Stress: No Stress Concern Present (07/12/2018)   [employer] of Occupational Health - Occupational Stress Questionnaire    Feeling of Stress : Not at all  Social Connections: Unknown (07/12/2018)   Social Connection and Isolation Panel [NHANES]    Frequency of Communication with Friends and Family: Not on file    Frequency of Social Gatherings with Friends and Family: Not on file    Attends Religious Services: More than 4 times per year    Active Member of [employer] or Organizations: No    Attends Banker Meetings: Never    Marital Status: Married    Allergies:  Allergies  Allergen Reactions   Amantadine Other (See Comments)    disorientation   Benztropine    Geodon  [Ziprasidone Hcl] Other (See Comments)    disorientation   Sulfa Antibiotics Itching and Swelling    Topical only   Z-Pak [Azithromycin]     hallucinations    Metabolic Disorder Labs: Lab Results  Component Value Date   HGBA1C 5.7 (H) 09/19/2020   MPG 116.89 09/19/2020   Lab Results  Component Value Date   PROLACTIN 15.8 09/19/2020   Lab Results   Component Value Date   CHOL 235 (H) 11/28/2020   TRIG 94 11/28/2020   HDL 63 11/28/2020   CHOLHDL 3.7 11/28/2020   VLDL 19 11/28/2020   LDLCALC 153 (H) 11/28/2020   Lab Results  Component Value Date   TSH 1.601 02/12/2022    Therapeutic Level Labs: No results found for: "LITHIUM" No results found for: "VALPROATE" No results found for: "CBMZ"  Current Medications: Current Outpatient Medications  Medication Sig Dispense Refill   acetaminophen (TYLENOL) 325 MG tablet Take by mouth.     B Complex Vitamins (B COMPLEX  PO) Take by mouth.     celecoxib (CELEBREX) 100 MG capsule Take 1 capsule by mouth 2 (two) times daily.     Coenzyme Q10 (COQ10 PO) Take by mouth.     FLUoxetine (PROZAC) 20 MG capsule Take 1 capsule (20 mg total) by mouth daily. Take along with 40 mg daily 90 capsule 3   FLUoxetine (PROZAC) 40 MG capsule Take 1 capsule (40 mg total) by mouth daily. Takes a 20 mg along with it 90 capsule 3   fluticasone (FLONASE) 50 MCG/ACT nasal spray Place into both nostrils daily.     gabapentin (NEURONTIN) 100 MG capsule Take 1 capsule by mouth 2 (two) times daily.     Glucosamine-Chondroitin (GLUCOSAMINE CHONDR COMPLEX PO) Take by mouth daily.     hydrOXYzine (VISTARIL) 25 MG capsule TAKE 1 CAP BY MOUTH DAILY AS NEEDED TO BE TAKEN 1-3 TIMES A WEEK AS NEEDED FOR SEVERE PANIC ATTACKS 90 capsule 1   lamoTRIgine (LAMICTAL) 150 MG tablet Take 1 tablet (150 mg total) by mouth 2 (two) times daily. 180 tablet 3   MILK THISTLE PO Take by mouth.     Multiple Vitamin (MULTI-VITAMIN) tablet Take 1 tablet by mouth daily.     neomycin-polymyxin b-dexamethasone (MAXITROL) 3.5-10000-0.1 OINT      omeprazole (PRILOSEC) 40 MG capsule TAKE 1 CAPSULE BY MOUTH EVERY DAY 90 capsule 0   QUEtiapine (SEROQUEL) 50 MG tablet TAKE 1 TABLET BY MOUTH EVERYDAY AT BEDTIME 90 tablet 1   SM CALCIUM 600/VITAMIN D 600-400 MG-UNIT tablet Take 1 tablet by mouth 2 (two) times daily.     SYNTHROID 150 MCG tablet Take  150 mcg by mouth daily.     traZODone (DESYREL) 100 MG tablet TAKE 1 AND 1/2 TO 2 TABLETS BY MOUTH AT BEDTIME AS  NEEDED FOR SLEEP 180 tablet 3   triamcinolone cream (KENALOG) 0.1 % TRIAMCINOLONE ACETONIDE, 0.1% (External Cream)  1 (one) Cream Cream use as directed for 0 days  Quantity: 30;  Refills: 1   Ordered :12-Jan-2014  Awilda Billhambers, [place]a ;  Started 12-Jan-2014 Active     Current Facility-Administered Medications  Medication Dose Route Frequency Provider Last Rate Last Admin   0.9 %  sodium chloride infusion  500 mL Intravenous Continuous Iva BoopGessner, Carl E, MD         Musculoskeletal: Strength & Muscle Tone: within normal limits Gait & Station: normal Patient leans: N/A  Psychiatric Specialty Exam: Review of Systems  Psychiatric/Behavioral:  The patient is nervous/anxious.   All other systems reviewed and are negative.   Blood pressure 113/70, pulse 96, temperature (!) 97 F (36.1 C), temperature source Skin, height 5' 6.5" (1.689 m), weight 204 lb 12.8 oz (92.9 kg).Body mass index is 32.56 kg/m.  General Appearance: Casual  Eye Contact:  Fair  Speech:  Clear and Coherent  Volume:  Normal  Mood:  Anxious  Affect:  Congruent  Thought Process:  Goal Directed and Descriptions of Associations: Intact  Orientation:  Full (Time, Place, and Person)  Thought Content: Logical   Suicidal Thoughts:  No  Homicidal Thoughts:  No  Memory:  Immediate;   Fair Recent;   Fair Remote;   Fair  Judgement:  Fair  Insight:  Fair  Psychomotor Activity:  Normal  Concentration:  Concentration: Fair and Attention Span: Fair  Recall:  [employer]Fair  Fund of Knowledge: Fair  Language: Fair  Akathisia:  No  Handed:  Right  AIMS (if indicated): not done  Assets:  Communication Skills Desire for  Improvement Housing Intimacy Resilience Social Support  ADL's:  Intact  Cognition: WNL  Sleep:  Fair   Screenings: MidwifeAIMS    Flowsheet Row [facility] from [facility]20[facility]  AIMS Total Score 0 0 0 0 0      GAD-7    Flowsheet Row [facility] from [facility]20[facility] Office Visit from [facility] from [facility] from [facility]  Total GAD-7 Score 1 2 6  0 4      PHQ2-9    Flowsheet Row [facility] from [facility] Office Visit from [facility]05[facility]20[facility] Office Visit from [facility]/14/2021 [facility]  PHQ-2 Total Score 0 3 1 0 1  PHQ-9 Total Score 1 6 7 1 9       Flowsheet Row [facility]  C-SSRS RISK CATEGORY No Risk No Risk No Risk        Assessment and Plan: Jamie ChancellorLinda W Baxter is a 72 year old Caucasian female who has a history of MDD, panic attacks, GERD, thyroid cancer in remission, osteoarthritis was evaluated in office today.  Patient is currently improving although  continues to have anxiety mostly due to situational, relationship stressors.  Plan as noted below.  Plan MDD-in remission Prozac 60 mg p.o. daily Lamotrigine 150 mg p.o. twice daily Seroquel 50 mg p.o. nightly Patient currently on a wait list for CBT-pending  Panic disorder-improving Patient continues to have mild anxiety regarding her relationship struggles with her friend. Patient to start CBT Prozac 60 mg p.o. daily Hydroxyzine 25 mg as needed for severe anxiety attacks only  Insomnia-stable Seroquel 50 mg p.o. nightly Trazodone 100 mg take 1/2 to 2 tablets at bedtime as needed  Memory impairment-improving Will monitor closely.  Patient was referred for neurological evaluation however by neurologist patient to be referred back as needed after depression symptoms improves.  Patient will benefit from labs-advised to get lipid panel, hemoglobin A1c repeated.  Patient to discuss with primary care provider.  Follow-up in clinic in 2 months or sooner if needed.   Collaboration of Care: Collaboration of Care: Referral or follow-up with counselor/therapist AEB patient encouraged to start CBT.  Patient/Guardian was advised Release of Information must be obtained prior to any record release in order to collaborate their care with an outside provider. Patient/Guardian was advised if they have not already done so to contact the registration department to sign all necessary forms in order for us to release information regarding their care.   Consent: Patient/Guardian gives verbal consent for treatment and assignment of benefits for services provided during this visit. Patient/Guardian expressed understanding and agreed to proceed.  This note was generated in part or whole with voice recognition software. Voice recognition is usually quite accurate but there are transcription errors that can and very often do occur. I apologize for any typographical errors that were not detected and  corrected.    Jomarie Longs, MD 05/08/2022, 12:49 PM

## 2022-05-11 DIAGNOSIS — M25512 Pain in left shoulder: Secondary | ICD-10-CM | POA: Diagnosis not present

## 2022-05-14 DIAGNOSIS — M25512 Pain in left shoulder: Secondary | ICD-10-CM | POA: Diagnosis not present

## 2022-05-18 DIAGNOSIS — M25512 Pain in left shoulder: Secondary | ICD-10-CM | POA: Diagnosis not present

## 2022-05-21 DIAGNOSIS — M25512 Pain in left shoulder: Secondary | ICD-10-CM | POA: Diagnosis not present

## 2022-05-25 DIAGNOSIS — F419 Anxiety disorder, unspecified: Secondary | ICD-10-CM | POA: Diagnosis not present

## 2022-05-25 DIAGNOSIS — J019 Acute sinusitis, unspecified: Secondary | ICD-10-CM | POA: Diagnosis not present

## 2022-05-25 DIAGNOSIS — K219 Gastro-esophageal reflux disease without esophagitis: Secondary | ICD-10-CM | POA: Diagnosis not present

## 2022-05-25 DIAGNOSIS — R0789 Other chest pain: Secondary | ICD-10-CM | POA: Diagnosis not present

## 2022-05-25 DIAGNOSIS — Z20822 Contact with and (suspected) exposure to covid-19: Secondary | ICD-10-CM | POA: Diagnosis not present

## 2022-06-01 DIAGNOSIS — J069 Acute upper respiratory infection, unspecified: Secondary | ICD-10-CM | POA: Diagnosis not present

## 2022-06-01 DIAGNOSIS — R0789 Other chest pain: Secondary | ICD-10-CM | POA: Diagnosis not present

## 2022-06-01 DIAGNOSIS — N1831 Chronic kidney disease, stage 3a: Secondary | ICD-10-CM | POA: Diagnosis not present

## 2022-06-02 LAB — LAB REPORT - SCANNED: EGFR: 71

## 2022-06-04 DIAGNOSIS — M25512 Pain in left shoulder: Secondary | ICD-10-CM | POA: Diagnosis not present

## 2022-06-08 DIAGNOSIS — M25512 Pain in left shoulder: Secondary | ICD-10-CM | POA: Diagnosis not present

## 2022-06-11 DIAGNOSIS — M25512 Pain in left shoulder: Secondary | ICD-10-CM | POA: Diagnosis not present

## 2022-06-15 ENCOUNTER — Other Ambulatory Visit: Payer: Self-pay | Admitting: Psychiatry

## 2022-06-15 DIAGNOSIS — Z9189 Other specified personal risk factors, not elsewhere classified: Secondary | ICD-10-CM

## 2022-06-15 DIAGNOSIS — G4701 Insomnia due to medical condition: Secondary | ICD-10-CM

## 2022-06-15 DIAGNOSIS — F331 Major depressive disorder, recurrent, moderate: Secondary | ICD-10-CM

## 2022-06-15 DIAGNOSIS — F410 Panic disorder [episodic paroxysmal anxiety] without agoraphobia: Secondary | ICD-10-CM

## 2022-06-16 DIAGNOSIS — M25512 Pain in left shoulder: Secondary | ICD-10-CM | POA: Diagnosis not present

## 2022-06-18 DIAGNOSIS — Z139 Encounter for screening, unspecified: Secondary | ICD-10-CM | POA: Diagnosis not present

## 2022-06-18 DIAGNOSIS — J069 Acute upper respiratory infection, unspecified: Secondary | ICD-10-CM | POA: Diagnosis not present

## 2022-06-19 DIAGNOSIS — M75122 Complete rotator cuff tear or rupture of left shoulder, not specified as traumatic: Secondary | ICD-10-CM | POA: Diagnosis not present

## 2022-07-02 ENCOUNTER — Telehealth: Payer: Self-pay

## 2022-07-02 DIAGNOSIS — F410 Panic disorder [episodic paroxysmal anxiety] without agoraphobia: Secondary | ICD-10-CM

## 2022-07-02 DIAGNOSIS — F3342 Major depressive disorder, recurrent, in full remission: Secondary | ICD-10-CM

## 2022-07-02 DIAGNOSIS — J309 Allergic rhinitis, unspecified: Secondary | ICD-10-CM | POA: Diagnosis not present

## 2022-07-02 DIAGNOSIS — M545 Low back pain, unspecified: Secondary | ICD-10-CM | POA: Diagnosis not present

## 2022-07-02 MED ORDER — FLUOXETINE HCL 20 MG PO CAPS: 20.0000 mg | ORAL_CAPSULE | Freq: Every day | ORAL | 3 refills | Status: DC

## 2022-07-02 MED ORDER — FLUOXETINE HCL 40 MG PO CAPS: 40.0000 mg | ORAL_CAPSULE | Freq: Every day | ORAL | 3 refills | Status: DC

## 2022-07-02 NOTE — Telephone Encounter (Signed)
I have sent fluoxetine 20 mg and 40 mg to [facility] as requested.

## 2022-07-02 NOTE — Telephone Encounter (Signed)
pt called states that she needs a refillon the fluoxeine 40mg  and 20mg  send to the [facility]. pt was last seen on 4-5 next appt 6-5

## 2022-07-02 NOTE — Telephone Encounter (Signed)
left message that rx was sent to pharmacy.  

## 2022-07-08 ENCOUNTER — Encounter: Payer: Self-pay | Admitting: Psychiatry

## 2022-07-08 ENCOUNTER — Ambulatory Visit (INDEPENDENT_AMBULATORY_CARE_PROVIDER_SITE_OTHER): Payer: Medicare HMO | Admitting: Psychiatry

## 2022-07-08 VITALS — BP 112/74 | HR 80 | Temp 98.7°F | Ht 66.5 in | Wt 200.6 lb

## 2022-07-08 DIAGNOSIS — G4701 Insomnia due to medical condition: Secondary | ICD-10-CM

## 2022-07-08 DIAGNOSIS — F410 Panic disorder [episodic paroxysmal anxiety] without agoraphobia: Secondary | ICD-10-CM | POA: Diagnosis not present

## 2022-07-08 DIAGNOSIS — F3342 Major depressive disorder, recurrent, in full remission: Secondary | ICD-10-CM | POA: Diagnosis not present

## 2022-07-08 DIAGNOSIS — R413 Other amnesia: Secondary | ICD-10-CM

## 2022-07-08 MED ORDER — LAMOTRIGINE 150 MG PO TABS: 150.0000 mg | ORAL_TABLET | Freq: Two times a day (BID) | ORAL | 3 refills | Status: DC

## 2022-07-08 MED ORDER — TRAZODONE HCL 100 MG PO TABS: ORAL_TABLET | ORAL | 3 refills | Status: DC

## 2022-07-08 NOTE — Progress Notes (Unsigned)
BH MD OP Progress Note  07/08/2022 11:09 AM Jamie Baxter  MRN:  [id number]  Chief Complaint:  Chief Complaint  Patient presents with   Follow-up   Anxiety   Depression   HPI: Jamie Baxter is a 72 year old Caucasian female, married, retired, lives in [place], has a history of MDD, panic disorder, osteoarthritis, history of thyroid cancer, GERD was evaluated in office today.  Patient today reports she is currently doing well.  She reports she is spending a lot more time outside in her garden, recently has been planting sun flowers which are coming along quite well.  She reports her sister who was having health issues has made a lot of progress and they are spending a lot more time together in the garden.    Patient reports overall mood symptoms are better.  Denies any significant sadness or anxiety symptoms.  She feels more motivated.  Her concentration has improved.  Denies any significant memory issues, forgetfulness.  Patient today appeared to be alert, oriented to person place time situation.  3 word memory immediate 3 out of 3, after 5 minutes 3 out of 3.  Patient was able to do calculations well, serial sevens.  Attention and focus seem to be good.  Patient denies any suicidality, homicidality or perceptual disturbances.  Currently tolerating medications like lamotrigine, trazodone, fluoxetine, Seroquel.  Denies side effects.  Patient reports sleep is good as long as she takes the trazodone and the Seroquel.  Patient denies any other concerns today.  Visit Diagnosis:    ICD-10-CM   1. MDD (major depressive disorder), recurrent, in full remission (HCC)  F33.42 lamoTRIgine (LAMICTAL) 150 MG tablet    2. Panic disorder  F41.0 lamoTRIgine (LAMICTAL) 150 MG tablet    3. Insomnia due to medical condition  G47.01 traZODone (DESYREL) 100 MG tablet   Mood, pain    4. Memory impairment  R41.3    Resolved      Past Psychiatric History: I have reviewed past psychiatric history from  progress note on 08/11/2018.  Past Medical History:  Past Medical History:  Diagnosis Date   Anxiety    Depression    GERD (gastroesophageal reflux disease)    Motion sickness    cars - [place]   Thyroid disease     Past Surgical History:  Procedure Laterality Date   ABSCESS DRAINAGE     BREAST BIOPSY     BREAST SURGERY     breast biopsy   CATARACT EXTRACTION W/PHACO Left 12/23/2021   Procedure: CATARACT EXTRACTION PHACO AND INTRAOCULAR LENS PLACEMENT (IOC) LEFT  4.14  00:36.2;  Surgeon: Galen Manila, MD;  Location: [facility];  Service: Ophthalmology;  Laterality: Left;   CATARACT EXTRACTION W/PHACO Right 01/06/2022   Procedure: CATARACT EXTRACTION PHACO AND INTRAOCULAR LENS PLACEMENT (IOC) RIGHT  5.02  00:35.1;  Surgeon: Galen Manila, MD;  Location: [facility];  Service: Ophthalmology;  Laterality: Right;   COLONOSCOPY  2006, 07/17/10   Normal (except melanosis), ? hx polyps in 1990's - [place]   rotator cuff     ROTATOR CUFF REPAIR Right 11/25/2021   [facility]   THYROID LOBECTOMY  1978   goiter   THYROIDECTOMY SUBSTERNAL  2010   left side/cancer/radioactive iodine 2 years in a row for Ca cells   VAGINAL HYSTERECTOMY     fiboid tumors    Family Psychiatric History: I have reviewed family psychiatric history from progress note on 08/11/2018.  Family History:  Family History  Problem  Relation Age of Onset   GER disease Father    Rheum arthritis Sister    Healthy Sister    Hypertension Mother    Mental illness Neg Hx     Social History: I have reviewed social history from progress note on 08/11/2018. Social History   Socioeconomic History   Marital status: Married    Spouse name: monroe   Number of children: 1   Years of education: Not on file   Highest education level: High school graduate  Occupational History   Not on file  Tobacco Use   Smoking status: Former    Types: Cigarettes    Quit date: 02/03/1976    Years since  quitting: 46.4   Smokeless tobacco: Never  Vaping Use   Vaping Use: Never used  Substance and Sexual Activity   Alcohol use: No   Drug use: No   Sexual activity: Not Currently  Other Topics Concern   Not on file  Social History Narrative   Not on file   Social Determinants of Health   Financial Resource Strain: Low Risk  (08/11/2018)   Overall Financial Resource Strain (CARDIA)    Difficulty of Paying Living Expenses: Not hard at all  Recent Concern: Financial Resource Strain - High Risk (07/12/2018)   Overall Financial Resource Strain (CARDIA)    Difficulty of Paying Living Expenses: Very hard  Food Insecurity: No Food Insecurity (08/11/2018)   Hunger Vital Sign    Worried About Running Out of Food in the Last Year: Never true    Ran Out of Food in the Last Year: Never true  Recent Concern: Food Insecurity - Food Insecurity Present (07/12/2018)   Hunger Vital Sign    Worried About Running Out of Food in the Last Year: Often true    Ran Out of Food in the Last Year: Often true  Transportation Needs: No Transportation Needs (07/12/2018)   PRAPARE - Administrator, Civil Service (Medical): No    Lack of Transportation (Non-Medical): No  Physical Activity: Insufficiently Active (07/12/2018)   Exercise Vital Sign    Days of Exercise per Week: 5 days    Minutes of Exercise per Session: 20 min  Stress: No Stress Concern Present (07/12/2018)   [employer] of Occupational Health - Occupational Stress Questionnaire    Feeling of Stress : Not at all  Social Connections: Unknown (07/12/2018)   Social Connection and Isolation Panel [NHANES]    Frequency of Communication with Friends and Family: Not on file    Frequency of Social Gatherings with Friends and Family: Not on file    Attends Religious Services: More than 4 times per year    Active Member of [employer] or Organizations: No    Attends Banker Meetings: Never    Marital Status: Married    Allergies:  Allergies   Allergen Reactions   Amantadine Other (See Comments)    disorientation   Benztropine    Geodon  [Ziprasidone Hcl] Other (See Comments)    disorientation   Sulfa Antibiotics Itching and Swelling    Topical only   Z-Pak [Azithromycin]     hallucinations    Metabolic Disorder Labs: Lab Results  Component Value Date   HGBA1C 5.7 (H) 09/19/2020   MPG 116.89 09/19/2020   Lab Results  Component Value Date   PROLACTIN 15.8 09/19/2020   Lab Results  Component Value Date   CHOL 235 (H) 11/28/2020   TRIG 94 11/28/2020   HDL 63  11/28/2020   CHOLHDL 3.7 11/28/2020   VLDL 19 11/28/2020   LDLCALC 153 (H) 11/28/2020   Lab Results  Component Value Date   TSH 1.601 02/12/2022    Therapeutic Level Labs: No results found for: "LITHIUM" No results found for: "VALPROATE" No results found for: "CBMZ"  Current Medications: Current Outpatient Medications  Medication Sig Dispense Refill   acetaminophen (TYLENOL) 325 MG tablet Take by mouth.     AREXVY 120 MCG/0.5ML injection      B Complex Vitamins (B COMPLEX PO) Take by mouth.     celecoxib (CELEBREX) 100 MG capsule Take 1 capsule by mouth 2 (two) times daily.     Coenzyme Q10 (COQ10 PO) Take by mouth.     FLUoxetine (PROZAC) 20 MG capsule Take 1 capsule (20 mg total) by mouth daily. Take along with 40 mg daily 90 capsule 3   FLUoxetine (PROZAC) 40 MG capsule Take 1 capsule (40 mg total) by mouth daily. Takes a 20 mg along with it 90 capsule 3   fluticasone (FLONASE) 50 MCG/ACT nasal spray Place into both nostrils daily.     gabapentin (NEURONTIN) 100 MG capsule Take 1 capsule by mouth 2 (two) times daily.     Glucosamine-Chondroitin (GLUCOSAMINE CHONDR COMPLEX PO) Take by mouth daily.     hydrOXYzine (VISTARIL) 25 MG capsule TAKE 1 CAP BY MOUTH DAILY AS NEEDED TO BE TAKEN 1-3 TIMES A WEEK AS NEEDED FOR SEVERE PANIC ATTACKS 90 capsule 1   lidocaine (LIDODERM) 5 % SMARTSIG:Topical     MILK THISTLE PO Take by mouth.     Multiple  Vitamin (MULTI-VITAMIN) tablet Take 1 tablet by mouth daily.     neomycin-polymyxin b-dexamethasone (MAXITROL) 3.5-10000-0.1 OINT      omeprazole (PRILOSEC) 40 MG capsule TAKE 1 CAPSULE BY MOUTH EVERY DAY 90 capsule 0   QUEtiapine (SEROQUEL) 50 MG tablet TAKE 1 TABLET BY MOUTH EVERYDAY AT BEDTIME 90 tablet 1   SM CALCIUM 600/VITAMIN D 600-400 MG-UNIT tablet Take 1 tablet by mouth 2 (two) times daily.     SYNTHROID 150 MCG tablet Take 150 mcg by mouth daily.     triamcinolone cream (KENALOG) 0.1 % TRIAMCINOLONE ACETONIDE, 0.1% (External Cream)  1 (one) Cream Cream use as directed for 0 days  Quantity: 30;  Refills: 1   Ordered :12-Jan-2014  Awilda Bill ;  Started 12-Jan-2014 Active     lamoTRIgine (LAMICTAL) 150 MG tablet Take 1 tablet (150 mg total) by mouth 2 (two) times daily. 180 tablet 3   traZODone (DESYREL) 100 MG tablet TAKE 1 AND 1/2 TO 2 TABLETS BY MOUTH AT BEDTIME AS  NEEDED FOR SLEEP 180 tablet 3   Current Facility-Administered Medications  Medication Dose Route Frequency Provider Last Rate Last Admin   0.9 %  sodium chloride infusion  500 mL Intravenous Continuous Iva Boop, MD         Musculoskeletal: Strength & Muscle Tone: within normal limits Gait & Station: normal Patient leans: N/A  Psychiatric Specialty Exam: Review of Systems  Psychiatric/Behavioral: Negative.      Blood pressure 112/74, pulse 80, temperature 98.7 F (37.1 C), temperature source Temporal, height 5' 6.5" (1.689 m), weight 200 lb 9.6 oz (91 kg), SpO2 98 %.Body mass index is 31.89 kg/m.  General Appearance: Fairly Groomed  Eye Contact:  Fair  Speech:  Clear and Coherent  Volume:  Normal  Mood:  Euthymic  Affect:  Congruent  Thought Process:  Goal Directed  Orientation:  Full (Time, Place,  and Person)  Thought Content: Logical   Suicidal Thoughts:  No  Homicidal Thoughts:  No  Memory:  Immediate;   Fair Recent;   Fair Remote;   Fair  Judgement:  Fair  Insight:  Fair   Psychomotor Activity:  Normal  Concentration:  Concentration: Fair and Attention Span: Fair  Recall:  [employer] of Knowledge: Fair  Language: Fair  Akathisia:  No  Handed:  Right  AIMS (if indicated): done  Assets:  Communication Skills Desire for Improvement Housing Social Support  ADL's:  Intact  Cognition: WNL  Sleep:  Fair   Screenings: Midwife Visit from 07/08/2022 in [facility] Office Visit from 03/09/2022 in [facility] Office Visit from 08/14/2021 in [facility] Office Visit from 05/23/2021 in [facility] Office Visit from 11/26/2020 in [facility]  AIMS Total Score 0 0 0 0 0      GAD-7    Flowsheet Row Office Visit from 07/08/2022 in [facility] Office Visit from 05/08/2022 in [facility] Office Visit from 03/09/2022 in [facility] Office Visit from 02/11/2022 in [facility] Office Visit from 11/05/2021 in [facility]  Total GAD-7 Score 0 1 2 6  0      PHQ2-9    Flowsheet Row Office Visit from 07/08/2022 in [facility] Office Visit from 05/08/2022 in [facility] Office Visit from 03/09/2022 in [facility] Office Visit from 02/11/2022 in [facility] Office Visit from 11/05/2021 in [facility]  PHQ-2 Total Score 0 0 3 1 0  PHQ-9 Total Score -- 1 6 7 1       Flowsheet Row Office Visit from 07/08/2022 in [facility] Office Visit  from 05/08/2022 in [facility] Office Visit from 03/09/2022 in [facility]  C-SSRS RISK CATEGORY Low Risk No Risk No Risk        Assessment and Plan: Jamie Baxter is a 72 year old Caucasian female who has a history of MDD, panic attacks, GERD, thyroid cancer in remission, osteoarthritis was evaluated in office today.  Patient is currently stable.  Plan as noted below.  Plan MDD in remission Prozac 60 mg p.o. daily Lamotrigine 150 mg p.o. twice daily Seroquel 50 mg p.o. nightly Patient was referred for CBT-however reports her health insurance will not cover it.  Panic disorder-stable Prozac 60 mg p.o. daily Hydroxyzine 25 mg p.o. daily as needed for severe anxiety attacks only.  Insomnia-stable Seroquel 50 mg p.o. nightly Trazodone 100 mg, take half to 2 tablets at bedtime as needed. Continue sleep hygiene techniques.  Memory impairment-resolved Patient is currently doing well.  I have reviewed labs report brought in by patient-dated 06/01/2022-CBC, CMP, TSH-within normal limits.  Patient will benefit from lipid panel, hemoglobin A1c-pending  Follow-up in clinic in 4 to 5 months or sooner if needed.    Consent: Patient/Guardian gives verbal consent for treatment and assignment of benefits for services provided during this visit. Patient/Guardian expressed understanding and agreed to proceed.   This note was generated in part or whole with voice recognition software. Voice recognition is usually quite  accurate but there are transcription errors that can and very often do occur. I apologize for any typographical errors that were not detected and corrected.    Jomarie Longs, MD 07/08/2022, 11:09 AM

## 2022-08-18 ENCOUNTER — Other Ambulatory Visit: Payer: Self-pay | Admitting: Psychiatry

## 2022-08-18 DIAGNOSIS — Z9189 Other specified personal risk factors, not elsewhere classified: Secondary | ICD-10-CM

## 2022-08-18 DIAGNOSIS — F410 Panic disorder [episodic paroxysmal anxiety] without agoraphobia: Secondary | ICD-10-CM

## 2022-08-18 DIAGNOSIS — G4701 Insomnia due to medical condition: Secondary | ICD-10-CM

## 2022-08-18 DIAGNOSIS — F331 Major depressive disorder, recurrent, moderate: Secondary | ICD-10-CM

## 2022-08-19 DIAGNOSIS — Z79899 Other long term (current) drug therapy: Secondary | ICD-10-CM | POA: Diagnosis not present

## 2022-08-19 DIAGNOSIS — M159 Polyosteoarthritis, unspecified: Secondary | ICD-10-CM | POA: Diagnosis not present

## 2022-08-31 ENCOUNTER — Telehealth: Payer: Self-pay

## 2022-08-31 NOTE — Telephone Encounter (Signed)
pt was called and notified that rx was already at pharmacy but at the time that it was sent 7-16 it was too early to process.; but,  they are working on it now so check with pharamcy later today and it should be ready.

## 2022-08-31 NOTE — Telephone Encounter (Signed)
pt called left a message that she needed a refill on the quetiapine. pt was last seen on 6-5 next appt 10-30.

## 2022-08-31 NOTE — Telephone Encounter (Signed)
called pharmacy because it looks like rx was eprescribed on 7-16. pharmacy confirmed that they received rx but at the time they received it it was to early to fill. her reprocessed it and it went threw ok so they will get it ready.

## 2022-12-02 ENCOUNTER — Telehealth: Payer: Medicare HMO | Admitting: Psychiatry

## 2022-12-02 ENCOUNTER — Encounter: Payer: Self-pay | Admitting: Psychiatry

## 2022-12-02 DIAGNOSIS — F410 Panic disorder [episodic paroxysmal anxiety] without agoraphobia: Secondary | ICD-10-CM

## 2022-12-02 DIAGNOSIS — G4701 Insomnia due to medical condition: Secondary | ICD-10-CM

## 2022-12-02 DIAGNOSIS — F3342 Major depressive disorder, recurrent, in full remission: Secondary | ICD-10-CM

## 2022-12-02 NOTE — Progress Notes (Unsigned)
Virtual Visit via Video Note  I connected with Jamie Baxter on 12/02/22 at  9:30 AM EDT by a video enabled telemedicine application and verified that I am speaking with the correct person using two identifiers.  Location Provider Location : [facility] Patient Location : Home  Participants: Patient , Provider   I discussed the limitations of evaluation and management by telemedicine and the availability of in person appointments. The patient expressed understanding and agreed to proceed.     I discussed the assessment and treatment plan with the patient. The patient was provided an opportunity to ask questions and all were answered. The patient agreed with the plan and demonstrated an understanding of the instructions.   The patient was advised to call back or seek an in-person evaluation if the symptoms worsen or if the condition fails to improve as anticipated.   [facility] OP Progress Note  12/02/2022 10:00 AM Jamie Baxter  MRN:  [id number]  Chief Complaint:  Chief Complaint  Patient presents with   Follow-up   Depression   Anxiety   Medication Refill   HPI: Jamie Baxter is a 72 year old Caucasian female, married, retired, lives in [place], has a history of MDD, panic disorder, osteoarthritis, history of thyroid cancer, GERD was evaluated by telemedicine today.  Patient today reports she is currently doing fairly.  Denies any significant anxiety or depression symptoms.  She reports she has been active, has good support system from family and is coping with situational stressors.  Patient reports sleep is good.  She gets around 10 hours of sleep.  She feels rested when she wakes up in the morning.  She continues to take trazodone.  Denies side effects.  Patient reports she is compliant on the Lamictal, fluoxetine and Seroquel.  Denies side effects.  Patient reports memory problems as improved.  She takes a pharmacy brand brain health supplement.  That has helped with her memory  issues.  Patient today appeared to be alert, oriented to person place time situation.  3 word memory immediate 3 out of 3, after 5 minutes 3 out of 3.  Patient was able to do serial sevens, attention and focus seem to be good.  Patient currently denies any suicidality, homicidality or perceptual disturbances.  Patient denies any other concerns today.  Visit Diagnosis:    ICD-10-CM   1. MDD (major depressive disorder), recurrent, in full remission (HCC)  F33.42     2. Panic disorder  F41.0     3. Insomnia due to medical condition  G47.01    Mood      Past Psychiatric History: I have reviewed past psychiatric history from progress note on 08/11/2018.  Past Medical History:  Past Medical History:  Diagnosis Date   Anxiety    Depression    GERD (gastroesophageal reflux disease)    Motion sickness    cars - [place]   Thyroid disease     Past Surgical History:  Procedure Laterality Date   ABSCESS DRAINAGE     BREAST BIOPSY     BREAST SURGERY     breast biopsy   CATARACT EXTRACTION W/PHACO Left 12/23/2021   Procedure: CATARACT EXTRACTION PHACO AND INTRAOCULAR LENS PLACEMENT (IOC) LEFT  4.14  00:36.2;  Surgeon: Galen Manila, Jamie Baxter;  Location: [facility];  Service: Ophthalmology;  Laterality: Left;   CATARACT EXTRACTION W/PHACO Right 01/06/2022   Procedure: CATARACT EXTRACTION PHACO AND INTRAOCULAR LENS PLACEMENT (IOC) RIGHT  5.02  00:35.1;  Surgeon: Galen Manila,  Jamie Baxter;  Location: [facility];  Service: Ophthalmology;  Laterality: Right;   COLONOSCOPY  2006, 07/17/10   Normal (except melanosis), ? hx polyps in 1990's - [place]   rotator cuff     ROTATOR CUFF REPAIR Right 11/25/2021   [facility]   THYROID LOBECTOMY  1978   goiter   THYROIDECTOMY SUBSTERNAL  2010   left side/cancer/radioactive iodine 2 years in a row for Ca cells   VAGINAL HYSTERECTOMY     fiboid tumors    Family Psychiatric History: I have reviewed family psychiatric history from  progress note on 08/11/2018.  Family History:  Family History  Problem Relation Age of Onset   GER disease Father    Rheum arthritis Sister    Healthy Sister    Hypertension Mother    Mental illness Neg Hx     Social History: I have reviewed social history from progress note on 08/11/2018. Social History   Socioeconomic History   Marital status: Married    Spouse name: monroe   Number of children: 1   Years of education: Not on file   Highest education level: High school graduate  Occupational History   Not on file  Tobacco Use   Smoking status: Former    Current packs/day: 0.00    Types: Cigarettes    Quit date: 02/03/1976    Years since quitting: 46.8   Smokeless tobacco: Never  Vaping Use   Vaping status: Never Used  Substance and Sexual Activity   Alcohol use: No   Drug use: No   Sexual activity: Not Currently  Other Topics Concern   Not on file  Social History Narrative   Not on file   Social Determinants of Health   Financial Resource Strain: Low Risk  (08/11/2018)   Overall Financial Resource Strain (CARDIA)    Difficulty of Paying Living Expenses: Not hard at all  Recent Concern: Financial Resource Strain - High Risk (07/12/2018)   Overall Financial Resource Strain (CARDIA)    Difficulty of Paying Living Expenses: Very hard  Food Insecurity: No Food Insecurity (08/11/2018)   Hunger Vital Sign    Worried About Running Out of Food in the Last Year: Never true    Ran Out of Food in the Last Year: Never true  Recent Concern: Food Insecurity - Food Insecurity Present (07/12/2018)   Hunger Vital Sign    Worried About Running Out of Food in the Last Year: Often true    Ran Out of Food in the Last Year: Often true  Transportation Needs: No Transportation Needs (07/12/2018)   PRAPARE - Administrator, Civil Service (Medical): No    Lack of Transportation (Non-Medical): No  Physical Activity: Insufficiently Active (07/12/2018)   Exercise Vital Sign    Days of  Exercise per Week: 5 days    Minutes of Exercise per Session: 20 min  Stress: No Stress Concern Present (07/12/2018)   [employer] of Occupational Health - Occupational Stress Questionnaire    Feeling of Stress : Not at all  Social Connections: Unknown (07/12/2018)   Social Connection and Isolation Panel [NHANES]    Frequency of Communication with Friends and Family: Not on file    Frequency of Social Gatherings with Friends and Family: Not on file    Attends Religious Services: More than 4 times per year    Active Member of [employer] or Organizations: No    Attends Banker Meetings: Never    Marital Status: Married  Allergies:  Allergies  Allergen Reactions   Amantadine Other (See Comments)    disorientation   Benztropine    Geodon  [Ziprasidone Hcl] Other (See Comments)    disorientation   Sulfa Antibiotics Itching and Swelling    Topical only   Z-Pak [Azithromycin]     hallucinations    Metabolic Disorder Labs: Lab Results  Component Value Date   HGBA1C 5.7 (H) 09/19/2020   MPG 116.89 09/19/2020   Lab Results  Component Value Date   PROLACTIN 15.8 09/19/2020   Lab Results  Component Value Date   CHOL 235 (H) 11/28/2020   TRIG 94 11/28/2020   HDL 63 11/28/2020   CHOLHDL 3.7 11/28/2020   VLDL 19 11/28/2020   LDLCALC 153 (H) 11/28/2020   Lab Results  Component Value Date   TSH 1.601 02/12/2022    Therapeutic Level Labs: No results found for: "LITHIUM" No results found for: "VALPROATE" No results found for: "CBMZ"  Current Medications: Current Outpatient Medications  Medication Sig Dispense Refill   acetaminophen (TYLENOL) 325 MG tablet Take by mouth.     B Complex Vitamins (B COMPLEX PO) Take by mouth.     celecoxib (CELEBREX) 100 MG capsule Take 1 capsule by mouth 2 (two) times daily.     Coenzyme Q10 (COQ10 PO) Take by mouth.     FLUoxetine (PROZAC) 20 MG capsule Take 1 capsule (20 mg total) by mouth daily. Take along with 40 mg daily 90  capsule 3   FLUoxetine (PROZAC) 40 MG capsule Take 1 capsule (40 mg total) by mouth daily. Takes a 20 mg along with it 90 capsule 3   fluticasone (FLONASE) 50 MCG/ACT nasal spray Place into both nostrils daily.     gabapentin (NEURONTIN) 100 MG capsule Take 1 capsule by mouth 2 (two) times daily.     hydrOXYzine (VISTARIL) 25 MG capsule TAKE 1 CAP BY MOUTH DAILY AS NEEDED TO BE TAKEN 1-3 TIMES A WEEK AS NEEDED FOR SEVERE PANIC ATTACKS 90 capsule 1   lamoTRIgine (LAMICTAL) 150 MG tablet Take 1 tablet (150 mg total) by mouth 2 (two) times daily. 180 tablet 3   lidocaine (LIDODERM) 5 % SMARTSIG:Topical     MILK THISTLE PO Take by mouth.     Multiple Vitamin (MULTI-VITAMIN) tablet Take 1 tablet by mouth daily.     omeprazole (PRILOSEC) 40 MG capsule TAKE 1 CAPSULE BY MOUTH EVERY DAY 90 capsule 0   QUEtiapine (SEROQUEL) 50 MG tablet TAKE 1 TABLET BY MOUTH EVERYDAY AT BEDTIME 90 tablet 1   SM CALCIUM 600/VITAMIN D 600-400 MG-UNIT tablet Take 1 tablet by mouth 2 (two) times daily.     SYNTHROID 150 MCG tablet Take 150 mcg by mouth daily.     traZODone (DESYREL) 100 MG tablet TAKE 1 AND 1/2 TO 2 TABLETS BY MOUTH AT BEDTIME AS  NEEDED FOR SLEEP 180 tablet 3   triamcinolone cream (KENALOG) 0.1 % TRIAMCINOLONE ACETONIDE, 0.1% (External Cream)  1 (one) Cream Cream use as directed for 0 days  Quantity: 30;  Refills: 1   Ordered :12-Jan-2014  Awilda Bill ;  Started 12-Jan-2014 Active     AREXVY 120 MCG/0.5ML injection  (Patient not taking: Reported on 12/02/2022)     Current Facility-Administered Medications  Medication Dose Route Frequency Provider Last Rate Last Admin   0.9 %  sodium chloride infusion  500 mL Intravenous Continuous Iva Boop, Jamie Baxter         Musculoskeletal: Strength & Muscle Tone:  Rich Reining  Gait & Station: normal Patient leans: N/A  Psychiatric Specialty Exam: Review of Systems  Psychiatric/Behavioral: Negative.      There were no vitals taken for this visit.There is no  height or weight on file to calculate BMI.  General Appearance: Fairly Groomed  Eye Contact:  Fair  Speech:  Clear and Coherent  Volume:  Normal  Mood:  Euthymic  Affect:  Congruent  Thought Process:  Goal Directed and Descriptions of Associations: Intact  Orientation:  Full (Time, Place, and Person)  Thought Content: Logical   Suicidal Thoughts:  No  Homicidal Thoughts:  No  Memory:  Immediate;   Fair Recent;   Fair Remote;   Fair  Judgement:  Fair  Insight:  Fair  Psychomotor Activity:  Normal  Concentration:  Concentration: Fair and Attention Span: Fair  Recall:  [employer] of Knowledge: Fair  Language: Fair  Akathisia:  No  Handed:  Right  AIMS (if indicated): not done  Assets:  Communication Skills Desire for Improvement Housing Social Support  ADL's:  Intact  Cognition: WNL  Sleep:  Fair   Screenings: Midwife Visit from 07/08/2022 in [facility] Office Visit from 03/09/2022 in [facility] Office Visit from 08/14/2021 in [facility] Office Visit from 05/23/2021 in [facility] Office Visit from 11/26/2020 in [facility]  AIMS Total Score 0 0 0 0 0      GAD-7    Flowsheet Row Office Visit from 07/08/2022 in [facility] Office Visit from 05/08/2022 in [facility] Office Visit from 03/09/2022 in [facility] Office Visit from 02/11/2022 in [facility] Office Visit from 11/05/2021 in [facility]  Total GAD-7 Score 0 1 2 6  0      PHQ2-9    Flowsheet Row Video Visit from 12/02/2022 in [facility] Office Visit from  07/08/2022 in [facility] Office Visit from 05/08/2022 in [facility] Office Visit from 03/09/2022 in [facility] Office Visit from 02/11/2022 in [facility]  PHQ-2 Total Score 0 0 0 3 1  PHQ-9 Total Score -- -- 1 6 7       Flowsheet Row Video Visit from 12/02/2022 in [facility] Office Visit from 07/08/2022 in [facility] Office Visit from 05/08/2022 in [facility]  C-SSRS RISK CATEGORY No Risk Low Risk No Risk        Assessment and Plan: TAKESHA VANDERBUSH is a 72 year old Caucasian female who has a history of MDD, panic attacks, GERD, thyroid cancer in remission, osteoarthritis was evaluated by telemedicine today.  Patient is currently stable.  Plan as noted below.  Plan MDD in remission Prozac 60 mg p.o. daily Lamictal 150 mg p.o. twice daily Seroquel 50 mg p.o. nightly   Panic disorder-stable Prozac 60 mg p.o. daily Hydroxyzine 25 mg p.o. daily as needed for severe anxiety attacks only  Insomnia-stable Seroquel 50 mg p.o. nightly Trazodone 100 mg, takes half to 2 tablets at bedtime as needed Continue sleep hygiene techniques.  Follow-up in clinic in 6 months or sooner if needed.   Consent: Patient/Guardian gives verbal  consent for treatment and assignment of benefits for services provided during this visit. Patient/Guardian expressed understanding and agreed to proceed.   This note was generated in part or whole with voice recognition software. Voice recognition is usually quite accurate but there are transcription errors that can and very often do occur. I apologize for any typographical errors that were not detected and corrected.    Jamie Longs, Jamie Baxter 12/03/2022, 2:05 PM

## 2022-12-18 ENCOUNTER — Other Ambulatory Visit: Payer: Self-pay | Admitting: Psychiatry

## 2022-12-18 DIAGNOSIS — Z9189 Other specified personal risk factors, not elsewhere classified: Secondary | ICD-10-CM | POA: Diagnosis not present

## 2022-12-18 DIAGNOSIS — K5190 Ulcerative colitis, unspecified, without complications: Secondary | ICD-10-CM | POA: Diagnosis not present

## 2022-12-18 DIAGNOSIS — K219 Gastro-esophageal reflux disease without esophagitis: Secondary | ICD-10-CM | POA: Diagnosis not present

## 2022-12-18 DIAGNOSIS — F3341 Major depressive disorder, recurrent, in partial remission: Secondary | ICD-10-CM | POA: Diagnosis not present

## 2022-12-18 DIAGNOSIS — E890 Postprocedural hypothyroidism: Secondary | ICD-10-CM | POA: Diagnosis not present

## 2022-12-18 DIAGNOSIS — G4701 Insomnia due to medical condition: Secondary | ICD-10-CM

## 2022-12-18 DIAGNOSIS — Z79899 Other long term (current) drug therapy: Secondary | ICD-10-CM | POA: Diagnosis not present

## 2022-12-18 DIAGNOSIS — F410 Panic disorder [episodic paroxysmal anxiety] without agoraphobia: Secondary | ICD-10-CM

## 2022-12-18 DIAGNOSIS — F331 Major depressive disorder, recurrent, moderate: Secondary | ICD-10-CM

## 2023-01-13 DIAGNOSIS — J069 Acute upper respiratory infection, unspecified: Secondary | ICD-10-CM | POA: Diagnosis not present

## 2023-01-13 DIAGNOSIS — B850 Pediculosis due to Pediculus humanus capitis: Secondary | ICD-10-CM | POA: Diagnosis not present

## 2023-04-09 DIAGNOSIS — M150 Primary generalized (osteo)arthritis: Secondary | ICD-10-CM | POA: Diagnosis not present

## 2023-04-09 DIAGNOSIS — Z79899 Other long term (current) drug therapy: Secondary | ICD-10-CM | POA: Diagnosis not present

## 2023-04-09 LAB — LAB REPORT - SCANNED: EGFR: 53

## 2023-04-22 DIAGNOSIS — M3501 Sicca syndrome with keratoconjunctivitis: Secondary | ICD-10-CM | POA: Diagnosis not present

## 2023-04-22 DIAGNOSIS — H43813 Vitreous degeneration, bilateral: Secondary | ICD-10-CM | POA: Diagnosis not present

## 2023-04-22 DIAGNOSIS — Z0100 Encounter for examination of eyes and vision without abnormal findings: Secondary | ICD-10-CM | POA: Diagnosis not present

## 2023-04-22 DIAGNOSIS — Z961 Presence of intraocular lens: Secondary | ICD-10-CM | POA: Diagnosis not present

## 2023-05-11 ENCOUNTER — Encounter: Payer: Self-pay | Admitting: Psychiatry

## 2023-05-11 ENCOUNTER — Ambulatory Visit (INDEPENDENT_AMBULATORY_CARE_PROVIDER_SITE_OTHER): Payer: Medicare HMO | Admitting: Psychiatry

## 2023-05-11 VITALS — BP 122/80 | HR 78 | Temp 98.8°F | Ht 66.5 in | Wt 197.4 lb

## 2023-05-11 DIAGNOSIS — F331 Major depressive disorder, recurrent, moderate: Secondary | ICD-10-CM

## 2023-05-11 DIAGNOSIS — Z634 Disappearance and death of family member: Secondary | ICD-10-CM | POA: Diagnosis not present

## 2023-05-11 DIAGNOSIS — G4701 Insomnia due to medical condition: Secondary | ICD-10-CM | POA: Diagnosis not present

## 2023-05-11 DIAGNOSIS — F410 Panic disorder [episodic paroxysmal anxiety] without agoraphobia: Secondary | ICD-10-CM

## 2023-05-11 MED ORDER — FLUOXETINE HCL 40 MG PO CAPS: 80.0000 mg | ORAL_CAPSULE | Freq: Every day | ORAL | 1 refills | Status: DC

## 2023-05-11 MED ORDER — CLONAZEPAM 0.5 MG PO TABS: 0.2500 mg | ORAL_TABLET | Freq: Every day | ORAL | 0 refills | Status: DC | PRN

## 2023-05-11 NOTE — Progress Notes (Unsigned)
 [facility] MD OP Progress Note  05/11/2023 5:24 PM Jamie Baxter  MRN:  [id number]  Chief Complaint:  Chief Complaint  Patient presents with   Follow-up   Depression   Anxiety   Medication Refill   Discussed the use of AI scribe software for clinical note transcription with the patient, who gave verbal consent to proceed.  History of Present Illness Jamie Baxter is a 73 year old Caucasian female, married, retired, lives in [place], has a history of depression, panic disorder, osteoarthritis, history of thyroid cancer, GERD was evaluated in office today.  She is experiencing significant grief following the death of her sister, Jamie Baxter, who passed away on 2023-05-10, after being in hospice care for a year and a half. She feels emotionally numb and extremely tired, often sleeping intermittently in her chair. She has a persistent headache from crying and describes feeling 'drunk tired.'  She has difficulty sleeping despite taking Seroquel and trazodone, at doses of one and one and a half tablets, respectively. Her sleep is light, and she is not fully asleep but can hear things around her. She feels anxious during the day and has not taken hydroxyzine during the day, although she uses it as needed. She is considering using clonazepam for emergencies.  She fell on April 27, 2023, after losing her balance due to fatigue and hitting her toes on a baby gate. She sustained a bruise on her hip and hyperextended her shoulder while trying to catch herself, along with a bruise on her head from the fall. She is monitoring her shoulder and plans to contact her orthopedic surgeon if it does not improve.  She has a decreased appetite but is maintaining hydration, eating a meal daily for the past four to five days. She notes a small weight loss since her last visit six months ago. She is currently taking Prozac, Lamictal, and gabapentin, and is careful with her medication regimen, checking to ensure she takes them  correctly.  She is not interested in spending time with her older sister, Olegario Messier, whom she describes as self-absorbed and not listening to her. She feels angry and does not trust Olegario Messier, preferring to avoid her. She finds solace in spending time with her son and daughter-in-law's dog, which she describes as gentle and positive. She finds comfort in attending church, where she feels good and has friends, and is involved in activities such as doing puzzles and reading, spending time on her deck in the sunshine.  She currently denies any suicidality, homicidality or perceptual disturbances.  Visit Diagnosis:    ICD-10-CM   1. MDD (major depressive disorder), recurrent episode, moderate (HCC)  F33.1 FLUoxetine (PROZAC) 40 MG capsule    2. Panic disorder  F41.0 FLUoxetine (PROZAC) 40 MG capsule    clonazePAM (KLONOPIN) 0.5 MG tablet    3. Insomnia due to medical condition  G47.01    mood, pain    4. Bereavement  Z63.4 clonazePAM (KLONOPIN) 0.5 MG tablet      Past Psychiatric History: I have reviewed past psychiatric history from progress note on 08/11/2018.  Past Medical History:  Past Medical History:  Diagnosis Date   Anxiety    Depression    GERD (gastroesophageal reflux disease)    Motion sickness    cars - [place]   Thyroid disease     Past Surgical History:  Procedure Laterality Date   ABSCESS DRAINAGE     BREAST BIOPSY     BREAST SURGERY  breast biopsy   CATARACT EXTRACTION W/PHACO Left 12/23/2021   Procedure: CATARACT EXTRACTION PHACO AND INTRAOCULAR LENS PLACEMENT (IOC) LEFT  4.14  00:36.2;  Surgeon: Galen Manila, MD;  Location: [facility];  Service: Ophthalmology;  Laterality: Left;   CATARACT EXTRACTION W/PHACO Right 01/06/2022   Procedure: CATARACT EXTRACTION PHACO AND INTRAOCULAR LENS PLACEMENT (IOC) RIGHT  5.02  00:35.1;  Surgeon: Galen Manila, MD;  Location: [facility];  Service: Ophthalmology;  Laterality: Right;   COLONOSCOPY   2006, 07/17/10   Normal (except melanosis), ? hx polyps in 1990's - [place]   rotator cuff     ROTATOR CUFF REPAIR Right 11/25/2021   [facility]   THYROID LOBECTOMY  1978   goiter   THYROIDECTOMY SUBSTERNAL  2010   left side/cancer/radioactive iodine 2 years in a row for Ca cells   VAGINAL HYSTERECTOMY     fiboid tumors    Family Psychiatric History: I have reviewed family psychiatric history from progress note on 08/11/2018.  Family History:  Family History  Problem Relation Age of Onset   Hypertension Mother    GER disease Father    Rheum arthritis Sister    Healthy Sister    Mental illness Neg Hx     Social History: I have reviewed social history from progress note on 08/11/2018. Social History   Socioeconomic History   Marital status: Married    Spouse name: Jamie Baxter   Number of children: 1   Years of education: Not on file   Highest education level: High school graduate  Occupational History   Not on file  Tobacco Use   Smoking status: Former    Current packs/day: 0.00    Types: Cigarettes    Quit date: 02/03/1976    Years since quitting: 47.2   Smokeless tobacco: Never  Vaping Use   Vaping status: Never Used  Substance and Sexual Activity   Alcohol use: No   Drug use: No   Sexual activity: Not Currently  Other Topics Concern   Not on file  Social History Narrative   Not on file   Social Drivers of Health   Financial Resource Strain: Low Risk  (08/11/2018)   Overall Financial Resource Strain (CARDIA)    Difficulty of Paying Living Expenses: Not hard at all  Recent Concern: Financial Resource Strain - High Risk (07/12/2018)   Overall Financial Resource Strain (CARDIA)    Difficulty of Paying Living Expenses: Very hard  Food Insecurity: No Food Insecurity (08/11/2018)   Hunger Vital Sign    Worried About Running Out of Food in the Last Year: Never true    Ran Out of Food in the Last Year: Never true  Recent Concern: Food Insecurity - Food Insecurity Present  (07/12/2018)   Hunger Vital Sign    Worried About Running Out of Food in the Last Year: Often true    Ran Out of Food in the Last Year: Often true  Transportation Needs: No Transportation Needs (07/12/2018)   PRAPARE - Administrator, Civil Service (Medical): No    Lack of Transportation (Non-Medical): No  Physical Activity: Insufficiently Active (07/12/2018)   Exercise Vital Sign    Days of Exercise per Week: 5 days    Minutes of Exercise per Session: 20 min  Stress: No Stress Concern Present (07/12/2018)   [employer] of Occupational Health - Occupational Stress Questionnaire    Feeling of Stress : Not at all  Social Connections: Unknown (07/12/2018)   Social Connection  and Isolation Panel [NHANES]    Frequency of Communication with Friends and Family: Not on file    Frequency of Social Gatherings with Friends and Family: Not on file    Attends Religious Services: More than 4 times per year    Active Member of [employer] or Organizations: No    Attends Banker Meetings: Never    Marital Status: Married    Allergies:  Allergies  Allergen Reactions   Amantadine Other (See Comments)    disorientation   Benztropine    Geodon  [Ziprasidone Hcl] Other (See Comments)    disorientation   Sulfa Antibiotics Itching and Swelling    Topical only   Z-Pak [Azithromycin]     hallucinations    Metabolic Disorder Labs: Lab Results  Component Value Date   HGBA1C 5.7 (H) 09/19/2020   MPG 116.89 09/19/2020   Lab Results  Component Value Date   PROLACTIN 15.8 09/19/2020   Lab Results  Component Value Date   CHOL 235 (H) 11/28/2020   TRIG 94 11/28/2020   HDL 63 11/28/2020   CHOLHDL 3.7 11/28/2020   VLDL 19 11/28/2020   LDLCALC 153 (H) 11/28/2020   Lab Results  Component Value Date   TSH 1.601 02/12/2022    Therapeutic Level Labs: No results found for: "LITHIUM" No results found for: "VALPROATE" No results found for: "CBMZ"  Current Medications: Current  Outpatient Medications  Medication Sig Dispense Refill   acetaminophen (TYLENOL) 325 MG tablet Take by mouth.     AREXVY 120 MCG/0.5ML injection      B Complex Vitamins (B COMPLEX PO) Take by mouth.     celecoxib (CELEBREX) 100 MG capsule Take 1 capsule by mouth 2 (two) times daily.     clonazePAM (KLONOPIN) 0.5 MG tablet Take 0.5-1 tablets (0.25-0.5 mg total) by mouth daily as needed for anxiety. Must last 30 days 12 tablet 0   Coenzyme Q10 (COQ10 PO) Take by mouth.     FLUoxetine (PROZAC) 40 MG capsule Take 2 capsules (80 mg total) by mouth daily. 180 capsule 1   fluticasone (FLONASE) 50 MCG/ACT nasal spray Place into both nostrils daily.     hydrOXYzine (VISTARIL) 25 MG capsule TAKE 1 CAP BY MOUTH DAILY AS NEEDED TO BE TAKEN 1-3 TIMES A WEEK AS NEEDED FOR SEVERE PANIC ATTACKS 90 capsule 1   lamoTRIgine (LAMICTAL) 150 MG tablet Take 1 tablet (150 mg total) by mouth 2 (two) times daily. 180 tablet 3   lidocaine (LIDODERM) 5 % SMARTSIG:Topical     MILK THISTLE PO Take by mouth.     Multiple Vitamin (MULTI-VITAMIN) tablet Take 1 tablet by mouth daily.     omeprazole (PRILOSEC) 40 MG capsule TAKE 1 CAPSULE BY MOUTH EVERY DAY 90 capsule 0   QUEtiapine (SEROQUEL) 50 MG tablet TAKE 1 TABLET BY MOUTH EVERYDAY AT BEDTIME 90 tablet 1   SM CALCIUM 600/VITAMIN D 600-400 MG-UNIT tablet Take 1 tablet by mouth 2 (two) times daily.     SYNTHROID 150 MCG tablet Take 150 mcg by mouth daily.     traZODone (DESYREL) 100 MG tablet TAKE 1 AND 1/2 TO 2 TABLETS BY MOUTH AT BEDTIME AS  NEEDED FOR SLEEP 180 tablet 3   triamcinolone cream (KENALOG) 0.1 % TRIAMCINOLONE ACETONIDE, 0.1% (External Cream)  1 (one) Cream Cream use as directed for 0 days  Quantity: 30;  Refills: 1   Ordered :12-Jan-2014  Awilda Bill ;  Started 12-Jan-2014 Active     gabapentin (NEURONTIN) 100  MG capsule Take 1 capsule by mouth 2 (two) times daily.     Current Facility-Administered Medications  Medication Dose Route Frequency  Provider Last Rate Last Admin   0.9 %  sodium chloride infusion  500 mL Intravenous Continuous Iva Boop, MD         Musculoskeletal: Strength & Muscle Tone: within normal limits Gait & Station: normal Patient leans: N/A  Psychiatric Specialty Exam: Review of Systems  Psychiatric/Behavioral:  Positive for dysphoric mood. The patient is nervous/anxious.     Blood pressure 122/80, pulse 78, temperature 98.8 F (37.1 C), temperature source Temporal, height 5' 6.5" (1.689 m), weight 197 lb 6.4 oz (89.5 kg), SpO2 94%.Body mass index is 31.38 kg/m.  General Appearance: Casual  Eye Contact:  Good  Speech:  Clear and Coherent  Volume:  Normal  Mood:  Anxious and Depressed, grief  Affect:  Congruent  Thought Process:  Goal Directed and Descriptions of Associations: Intact  Orientation:  Full (Time, Place, and Person)  Thought Content: Logical   Suicidal Thoughts:  No  Homicidal Thoughts:  No  Memory:  Immediate;   Fair Recent;   Fair Remote;   Fair  Judgement:  Fair  Insight:  Fair  Psychomotor Activity:  Normal  Concentration:  Concentration: Fair and Attention Span: Fair  Recall:  [employer] of Knowledge: Fair  Language: Fair  Akathisia:  No  Handed:  Right  AIMS (if indicated): done  Assets:  Desire for Improvement Housing Social Support Transportation  ADL's:  Intact  Cognition: WNL  Sleep:  Fair on medications   Screenings: Geneticist, molecular Office Visit from 07/08/2022 in [facility] Office Visit from 03/09/2022 in [facility] Office Visit from 08/14/2021 in [facility] Office Visit from 05/23/2021 in [facility] Office Visit from 11/26/2020 in [facility]  AIMS Total Score 0 0 0 0 0      GAD-7    Flowsheet Row Office Visit from 07/08/2022 in [facility] Office Visit from 05/08/2022 in [facility] Office Visit from 03/09/2022 in [facility] Office Visit from 02/11/2022 in [facility] Office Visit from 11/05/2021 in [facility]  Total GAD-7 Score 0 1 2 6  0      PHQ2-9    Flowsheet Row Video Visit from 12/02/2022 in [facility] Office Visit from 07/08/2022 in [facility] Office Visit from 05/08/2022 in [facility] Office Visit from 03/09/2022 in [facility] Office Visit from 02/11/2022 in [facility]  PHQ-2 Total Score 0 0 0 3 1  PHQ-9 Total Score -- -- 1 6 7       Flowsheet Row Video Visit from 12/02/2022 in [facility] Office Visit from 07/08/2022 in [facility] Office Visit from 05/08/2022 in [facility]  C-SSRS RISK CATEGORY No Risk Low Risk No Risk        Assessment & Plan DENIAH SAIA is a 73 year old Caucasian female with history of MDD, grief, anxiety currently with worsening mood symptoms, grief reaction was evaluated in office today, discussed assessment and plan as noted below.  MDD-unstable  Bereavement-unstable Experiencing significant grief following the death of her sister, Jamie Baxter, compounded by symptoms of depression. Reports emotional numbness, fatigue, insomnia despite medication, anorexia, and weight loss. Not engaged in therapy but advised to seek grief counseling through hospice and explore support groups at her church. Grief is contributing to depressive symptoms. Increasing Prozac dosage from 60 mg to 80 mg to  address depression. Discussed the importance of counseling and setting boundaries with her sister Olegario Messier, who is a stressor. - Increase Prozac to 80 mg daily. - Continue Lamictal 150 mg twice daily - Continue Seroquel 50 mg at bedtime - Refer to hospice for grief counseling. - Encourage participation in church support groups. - Provide information on local therapists for potential counseling. - Continue journaling as a therapeutic activity. - Set boundaries with her sister Olegario Messier to reduce stress.  Panic disorder-unstable Reports daytime anxiety, using hydroxyzine as needed. Inquires about a less potent daily medication. Discussed clonazepam for emergency use, emphasizing its habit-forming potential. Clonazepam is in the same class as Valium, which she has tried before. - Prescribe Clonazepam 0.5 mg for emergency use only. - Reviewed [place] PMP AWARxE - Provide information on clonazepam usage and risks.  Insomnia-improving Experiencing difficulty sleeping, attributed to grief and emotional state. Currently taking Seroquel and trazodone to aid sleep, which are effective. - Continue current regimen of Seroquel. - Continue Trazodone 100 mg take half to 2 tablets at bedtime as needed - Continue sleep hygiene techniques  Reviewed most recent labs-I have reviewed most recent labs dated 04/09/2023-CBC with differential-within normal limits, AST/ALT-within normal limits.  Follow-up in clinic in 1 month or sooner if needed.     Collaboration of Care: Collaboration of Care: Referral or follow-up with counselor/therapist AEB patient encouraged to establish care with therapist  Patient/Guardian was advised Release of Information must be obtained prior to any record release in order to collaborate their care with an outside provider. Patient/Guardian was advised if they have not already done so to contact the registration department to sign all necessary forms in order for [place] to release information  regarding their care.   Consent: Patient/Guardian gives verbal consent for treatment and assignment of benefits for services provided during this visit. Patient/Guardian expressed understanding and agreed to proceed.   This note was generated in part or whole with voice recognition software. Voice recognition is usually quite accurate but there are transcription errors that can and very often do occur. I apologize for any typographical errors that were not detected and corrected.    Jomarie Longs, MD 05/11/2023, 5:24 PM

## 2023-05-11 NOTE — Patient Instructions (Signed)
 Clonazepam Tablets What is this medication? CLONAZEPAM (kloe NA ze pam) treats seizures. It may also be used to treat panic disorder. It works by Education administrator system calm down. It belongs to a group of medications called benzodiazepines. This medicine may be used for other purposes; ask your health care provider or pharmacist if you have questions. COMMON BRAND NAME(S): Ceberclon, Klonopin What should I tell my care team before I take this medication? They need to know if you have any of these conditions: Bipolar disorder, depression, psychosis, or other mental health conditions Glaucoma Kidney disease Liver disease Lung or breathing disease Myasthenia gravis Parkinson disease Porphyria Seizures or a history of seizures Substance use disorder Suicidal thoughts, plans, or attempt by you or a family member An unusual or allergic reaction to clonazepam, other medications, foods, dyes, or preservatives Pregnant or trying to get pregnant Breastfeeding How should I use this medication? Take this medication by mouth with water. Take it as directed on the prescription label at the same time every day. You can take it with or without food. If it upsets your stomach, take it with food. Do not take it more often than directed. Do not stop taking or change the dose except on the advice of your care team. A special MedGuide will be given to you by the pharmacist with each prescription and refill. Be sure to read this information carefully each time. Talk to your care team about the use of this medication in children. Special care may be needed. Overdosage: If you think you have taken too much of this medicine contact a poison control center or emergency room at once. NOTE: This medicine is only for you. Do not share this medicine with others. What if I miss a dose? If you miss a dose, take it as soon as you can. If it is almost time for your next dose, take only that dose. Do not take double  or extra doses. What may interact with this medication? Do not take this medication with any of the following: Opioid medications for cough Sodium oxybate This medication may also interact with the following: Alcohol Antihistamines for allergy, cough, and cold Antiviral medications for HIV or AIDS Certain medications for anxiety or sleep Certain medications for depression, such as amitriptyline, fluoxetine, sertraline Certain medications for fungal infections, such as ketoconazole and itraconazole Certain medications for seizures, such as carbamazepine, phenobarbital, phenytoin, primidone General anesthetics, such as halothane, isoflurane, methoxyflurane, propofol Local anesthetics, such as lidocaine, pramoxine, tetracaine Medications that relax muscles for surgery Opioid medications for pain Phenothiazines, such as chlorpromazine, mesoridazine, prochlorperazine, thioridazine This list may not describe all possible interactions. Give your health care provider a list of all the medicines, herbs, non-prescription drugs, or dietary supplements you use. Also tell them if you smoke, drink alcohol, or use illegal drugs. Some items may interact with your medicine. What should I watch for while using this medication? Visit your care team for regular checks on your progress. Tell your care team if your symptoms do not start to get better or if they get worse. Do not suddenly stop taking this medication. You may develop a severe reaction. Your care team will tell you how much medication to take. If your care team wants you to stop the medication, the dose may be slowly lowered over time to avoid any side effects. This medication may affect your coordination, reaction time, or judgment. Do not drive or operate machinery until you know how this medication affects you.  Sit up or stand slowly to reduce the risk of dizzy or fainting spells. Drinking alcohol with this medication can increase the risk of these  side effects. If you are taking another medication that also causes drowsiness, you may have more side effects. Give your care team a list of all medications you use. Your care team will tell you how much medication to take. Do not take more medication than directed. Get emergency help right away if you have problems breathing or unusual sleepiness. If you or your family notice any changes in your behavior, such as new or worsening depression, thoughts of harming yourself, anxiety, other unusual or disturbing thoughts, or memory loss, call your care team right away. Talk to your care team if you wish to become pregnant or think you might be pregnant. This medication can cause serious birth defects if taken during pregnancy. Talk to your care team before breastfeeding. Changes to your treatment plan may be needed. What side effects may I notice from receiving this medication? Side effects that you should report to your care team as soon as possible: Allergic reactions--skin rash, itching, hives, swelling of the face, lips, tongue, or throat CNS depression--slow or shallow breathing, shortness of breath, feeling faint, dizziness, confusion, trouble staying awake Thoughts of suicide or self-harm, worsening mood, feelings of depression Side effects that usually do not require medical attention (report to your care team if they continue or are bothersome): Dizziness Drowsiness Headache This list may not describe all possible side effects. Call your doctor for medical advice about side effects. You may report side effects to FDA at 1-800-FDA-1088. Where should I keep my medication? Keep out of the reach of children and pets. This medication can be abused. Keep your medication in a safe place to protect it from theft. Do not share this medication with anyone. Selling or giving away this medication is dangerous and against the law. Store at room temperature between 15 and 30 degrees C (59 and 86 degrees F).  Protect from light. Keep container tightly closed. This medication may cause accidental overdose and death if taken by other adults, children, or pets. Mix any unused medication with a substance like cat litter or coffee grounds. Then throw the medication away in a sealed container like a sealed bag or a coffee can with a lid. Do not use the medication after the expiration date. NOTE: This sheet is a summary. It may not cover all possible information. If you have questions about this medicine, talk to your doctor, pharmacist, or health care provider.  www.openpathcollective.org  www.psychologytoday  [email or web address].wixsite.com [facility] [address], [place], [place] [address]   [phone]  [facility] [email or web address] [address] [place], [place] [address]  [phone]  [facility], [facility] [email or web address] [address] [place], [place] [address]  [phone]   Family solutions - [phone]  [facility] - [phone]  [facility] - [phone] counseling [phone]  [facility] [phone]   PodPark.tn this clinician can offer telehealth and has a sliding scale option  https://clark-gentry.info/ this group also offers sliding scale rates and is based out of [place]  Dr. Liborio Nixon with the [facility] specializes in divorce  [employer] [facility] has interns who offer sliding scale rates and some of the full time clinicians do, as well. You complete their contact form on  their website and the referrals coordinator will help to get connected to someone   [email or web address] [email or web address] [phone]  Medicaid below :  [facility] [address] [place], [place] [address]  [phone]    [employer] [address] [place], [place] [address]  [phone]    [facility] [address] [place], [place] [address]  [phone]  '     2024 Elsevier/Gold Standard (2021-10-15 00:00:00)

## 2023-05-28 ENCOUNTER — Other Ambulatory Visit: Payer: Self-pay | Admitting: Orthopedic Surgery

## 2023-05-28 DIAGNOSIS — M25512 Pain in left shoulder: Secondary | ICD-10-CM

## 2023-06-03 ENCOUNTER — Other Ambulatory Visit: Payer: Self-pay | Admitting: Psychiatry

## 2023-06-03 DIAGNOSIS — F410 Panic disorder [episodic paroxysmal anxiety] without agoraphobia: Secondary | ICD-10-CM

## 2023-06-03 DIAGNOSIS — G4701 Insomnia due to medical condition: Secondary | ICD-10-CM

## 2023-06-03 DIAGNOSIS — F3342 Major depressive disorder, recurrent, in full remission: Secondary | ICD-10-CM

## 2023-06-04 ENCOUNTER — Ambulatory Visit
Admission: RE | Admit: 2023-06-04 | Discharge: 2023-06-04 | Disposition: A | Discharge status: Home / Self Care | Source: Ambulatory Visit | Attending: Orthopedic Surgery | Admitting: Orthopedic Surgery

## 2023-06-04 ENCOUNTER — Other Ambulatory Visit: Payer: Self-pay | Admitting: Orthopedic Surgery

## 2023-06-04 DIAGNOSIS — M25512 Pain in left shoulder: Secondary | ICD-10-CM | POA: Insufficient documentation

## 2023-06-04 DIAGNOSIS — M7581 Other shoulder lesions, right shoulder: Secondary | ICD-10-CM | POA: Diagnosis not present

## 2023-06-04 DIAGNOSIS — Z043 Encounter for examination and observation following other accident: Secondary | ICD-10-CM | POA: Diagnosis not present

## 2023-06-04 DIAGNOSIS — M948X1 Other specified disorders of cartilage, shoulder: Secondary | ICD-10-CM | POA: Diagnosis not present

## 2023-06-04 DIAGNOSIS — M19011 Primary osteoarthritis, right shoulder: Secondary | ICD-10-CM | POA: Diagnosis not present

## 2023-06-24 ENCOUNTER — Telehealth (INDEPENDENT_AMBULATORY_CARE_PROVIDER_SITE_OTHER): Admitting: Psychiatry

## 2023-06-24 ENCOUNTER — Encounter: Payer: Self-pay | Admitting: Psychiatry

## 2023-06-24 DIAGNOSIS — F410 Panic disorder [episodic paroxysmal anxiety] without agoraphobia: Secondary | ICD-10-CM | POA: Diagnosis not present

## 2023-06-24 DIAGNOSIS — F3341 Major depressive disorder, recurrent, in partial remission: Secondary | ICD-10-CM

## 2023-06-24 DIAGNOSIS — Z634 Disappearance and death of family member: Secondary | ICD-10-CM | POA: Diagnosis not present

## 2023-06-24 DIAGNOSIS — G4701 Insomnia due to medical condition: Secondary | ICD-10-CM

## 2023-06-24 MED ORDER — QUETIAPINE FUMARATE 50 MG PO TABS: 50.0000 mg | ORAL_TABLET | Freq: Every day | ORAL | 3 refills | Status: AC

## 2023-06-24 NOTE — Progress Notes (Signed)
 Virtual Visit via Video Note  I connected with Jamie Baxter on 06/24/23 at  3:40 PM EDT by a video enabled telemedicine application and verified that I am speaking with the correct person using two identifiers.  Location Provider Location : [facility] Patient Location : Home  Participants: Patient , Provider   I discussed the limitations of evaluation and management by telemedicine and the availability of in person appointments. The patient expressed understanding and agreed to proceed.   I discussed the assessment and treatment plan with the patient. The patient was provided an opportunity to ask questions and all were answered. The patient agreed with the plan and demonstrated an understanding of the instructions.   The patient was advised to call back or seek an in-person evaluation if the symptoms worsen or if the condition fails to improve as anticipated.   BH MD OP Progress Note  06/25/2023 7:54 AM Jamie Baxter  MRN:  [id number]  Chief Complaint:  Chief Complaint  Patient presents with   Follow-up   Anxiety   Depression   Medication Refill   Discussed the use of AI scribe software for clinical note transcription with the patient, who gave verbal consent to proceed.  History of Present Illness Jamie Baxter is a 73 year old Caucasian female married, retired, lives in [place], has a history of depression, panic disorder, osteoarthritis, history of thyroid  cancer, GERD was evaluated by telemedicine today.  She feels much better since her last appointment in early April, attributing this improvement to distancing herself from her older sister, who was causing her stress. She experiences crying spells, which she finds beneficial, and engages in outdoor activities such as gardening and helping her husband build a deck.  She has been taking fluoxetine , which was increased to 80 mg, and noticed an immediate positive effect. No side effects from the medication are reported.  Continues to  be compliant on the Lamictal , Seroquel  and denies side effects.  She also denies recent panic attacks.  She has been using hydroxyzine  as needed for anxiety although not frequently.  Her sleep pattern is irregular; she sometimes struggles to fall asleep and wakes up early. She manages her sleep with trazodone , taking one and a half tablets on some days and two on others, which she finds effective.   She denies any suicidality, homicidality or perceptual disturbances.  Denies any other concerns today.    Visit Diagnosis:    ICD-10-CM   1. Recurrent major depressive disorder, in partial remission (HCC)  F33.41 QUEtiapine  (SEROQUEL ) 50 MG tablet    2. Panic disorder  F41.0 QUEtiapine  (SEROQUEL ) 50 MG tablet    3. Insomnia due to medical condition  G47.01 QUEtiapine  (SEROQUEL ) 50 MG tablet   mood, pain    4. Bereavement  Z63.4       Past Psychiatric History: I have reviewed past psychiatric history from progress note on 08/11/2018.  Past Medical History:  Past Medical History:  Diagnosis Date   Anxiety    Depression    GERD (gastroesophageal reflux disease)    Motion sickness    cars - [place] roads   Thyroid  disease     Past Surgical History:  Procedure Laterality Date   ABSCESS DRAINAGE     BREAST BIOPSY     BREAST SURGERY     breast biopsy   CATARACT EXTRACTION W/PHACO Left 12/23/2021   Procedure: CATARACT EXTRACTION PHACO AND INTRAOCULAR LENS PLACEMENT (IOC) LEFT  4.14  00:36.2;  Surgeon: Clair Crews, MD;  Location: [facility];  Service: Ophthalmology;  Laterality: Left;   CATARACT EXTRACTION W/PHACO Right 01/06/2022   Procedure: CATARACT EXTRACTION PHACO AND INTRAOCULAR LENS PLACEMENT (IOC) RIGHT  5.02  00:35.1;  Surgeon: Clair Crews, MD;  Location: [facility];  Service: Ophthalmology;  Laterality: Right;   COLONOSCOPY  2006, 07/17/10   Normal (except melanosis), ? hx polyps in 1990's - [place]    rotator cuff     ROTATOR CUFF REPAIR Right  11/25/2021   [facility]   THYROID  LOBECTOMY  1978   goiter   THYROIDECTOMY SUBSTERNAL  2010   left side/cancer/radioactive iodine 2 years in a row for Ca cells   VAGINAL HYSTERECTOMY     fiboid tumors    Family Psychiatric History: I have reviewed family psychiatric history from progress note on 08/11/2018.  Family History:  Family History  Problem Relation Age of Onset   Hypertension Mother    GER disease Father    Rheum arthritis Sister    Healthy Sister    Mental illness Neg Hx     Social History: I have reviewed social history from progress note on 08/11/2018. Social History   Socioeconomic History   Marital status: Married    Spouse name: Jamie Baxter   Number of children: 1   Years of education: Not on file   Highest education level: High school graduate  Occupational History   Not on file  Tobacco Use   Smoking status: Former    Current packs/day: 0.00    Types: Cigarettes    Quit date: 02/03/1976    Years since quitting: 47.4   Smokeless tobacco: Never  Vaping Use   Vaping status: Never Used  Substance and Sexual Activity   Alcohol use: No   Drug use: No   Sexual activity: Not Currently  Other Topics Concern   Not on file  Social History Narrative   Not on file   Social Drivers of Health   Financial Resource Strain: Low Risk  (08/11/2018)   Overall Financial Resource Strain (CARDIA)    Difficulty of Paying Living Expenses: Not hard at all  Recent Concern: Financial Resource Strain - High Risk (07/12/2018)   Overall Financial Resource Strain (CARDIA)    Difficulty of Paying Living Expenses: Very hard  Food Insecurity: No Food Insecurity (08/11/2018)   Hunger Vital Sign    Worried About Running Out of Food in the Last Year: Never true    Ran Out of Food in the Last Year: Never true  Recent Concern: Food Insecurity - Food Insecurity Present (07/12/2018)   Hunger Vital Sign    Worried About Running Out of Food in the Last Year: Often true    Ran Out of Food in the  Last Year: Often true  Transportation Needs: No Transportation Needs (07/12/2018)   PRAPARE - Administrator, Civil Service (Medical): No    Lack of Transportation (Non-Medical): No  Physical Activity: Insufficiently Active (07/12/2018)   Exercise Vital Sign    Days of Exercise per Week: 5 days    Minutes of Exercise per Session: 20 min  Stress: No Stress Concern Present (07/12/2018)   [employer] of Occupational Health - Occupational Stress Questionnaire    Feeling of Stress : Not at all  Social Connections: Unknown (07/12/2018)   Social Connection and Isolation Panel [NHANES]    Frequency of Communication with Friends and Family: Not on file    Frequency of Social Gatherings with Friends and Family: Not on file  Attends Religious Services: More than 4 times per year    Active Member of Clubs or Organizations: No    Attends Banker Meetings: Never    Marital Status: Married    Allergies:  Allergies  Allergen Reactions   Amantadine Other (See Comments)    disorientation   Benztropine    Geodon  [Ziprasidone Hcl] Other (See Comments)    disorientation   Sulfa Antibiotics Itching and Swelling    Topical only   Z-Pak [Azithromycin]     hallucinations    Metabolic Disorder Labs: Lab Results  Component Value Date   HGBA1C 5.7 (H) 09/19/2020   MPG 116.89 09/19/2020   Lab Results  Component Value Date   PROLACTIN 15.8 09/19/2020   Lab Results  Component Value Date   CHOL 235 (H) 11/28/2020   TRIG 94 11/28/2020   HDL 63 11/28/2020   CHOLHDL 3.7 11/28/2020   VLDL 19 11/28/2020   LDLCALC 153 (H) 11/28/2020   Lab Results  Component Value Date   TSH 1.601 02/12/2022    Therapeutic Level Labs: No results found for: "LITHIUM" No results found for: "VALPROATE" No results found for: "CBMZ"  Current Medications: Current Outpatient Medications  Medication Sig Dispense Refill   acetaminophen (TYLENOL) 325 MG tablet Take by mouth.     AREXVY  120 MCG/0.5ML injection      B Complex Vitamins (B COMPLEX PO) Take by mouth.     celecoxib (CELEBREX) 100 MG capsule Take 1 capsule by mouth 2 (two) times daily.     clonazePAM  (KLONOPIN ) 0.5 MG tablet Take 0.5-1 tablets (0.25-0.5 mg total) by mouth daily as needed for anxiety. Must last 30 days 12 tablet 0   Coenzyme Q10 (COQ10 PO) Take by mouth.     FLUoxetine  (PROZAC ) 40 MG capsule Take 2 capsules (80 mg total) by mouth daily. 180 capsule 1   fluticasone (FLONASE) 50 MCG/ACT nasal spray Place into both nostrils daily.     gabapentin (NEURONTIN) 100 MG capsule Take 1 capsule by mouth 2 (two) times daily.     hydrOXYzine  (VISTARIL ) 25 MG capsule TAKE 1 CAP BY MOUTH DAILY AS NEEDED TO BE TAKEN 1-3 TIMES A WEEK AS NEEDED FOR SEVERE PANIC ATTACKS 90 capsule 1   lamoTRIgine  (LAMICTAL ) 150 MG tablet TAKE 1 TABLET TWICE DAILY 180 tablet 3   lidocaine  (LIDODERM ) 5 % SMARTSIG:Topical     MILK THISTLE PO Take by mouth.     Multiple Vitamin (MULTI-VITAMIN) tablet Take 1 tablet by mouth daily.     omeprazole  (PRILOSEC) 40 MG capsule TAKE 1 CAPSULE BY MOUTH EVERY DAY 90 capsule 0   QUEtiapine  (SEROQUEL ) 50 MG tablet Take 1 tablet (50 mg total) by mouth at bedtime. 90 tablet 3   SM CALCIUM 600/VITAMIN D 600-400 MG-UNIT tablet Take 1 tablet by mouth 2 (two) times daily.     SYNTHROID 150 MCG tablet Take 150 mcg by mouth daily.     traZODone  (DESYREL ) 100 MG tablet TAKE 1 AND 1/2 TO 2 TABLETS BY MOUTH AT BEDTIME AS NEEDED FOR SLEEP 180 tablet 3   triamcinolone cream (KENALOG) 0.1 % TRIAMCINOLONE ACETONIDE, 0.1% (External Cream)  1 (one) Cream Cream use as directed for 0 days  Quantity: 30;  Refills: 1   Ordered :12-Jan-2014  Abron Hoist ;  Started 12-Jan-2014 Active     Current Facility-Administered Medications  Medication Dose Route Frequency Provider Last Rate Last Admin   0.9 %  sodium chloride  infusion  500 mL Intravenous  Continuous Kenney Peacemaker, MD         Musculoskeletal: Strength  & Muscle Tone: UTA Gait & Station: Seated Patient leans: N/A  Psychiatric Specialty Exam: Review of Systems  Psychiatric/Behavioral:  Positive for sleep disturbance. The patient is nervous/anxious.        Grief    There were no vitals taken for this visit.There is no height or weight on file to calculate BMI.  General Appearance: Casual  Eye Contact:  Fair  Speech:  Clear and Coherent  Volume:  Normal  Mood:  Anxious and grief- improving  Affect:  Appropriate  Thought Process:  Goal Directed and Descriptions of Associations: Intact  Orientation:  Full (Time, Place, and Person)  Thought Content: Logical   Suicidal Thoughts:  No  Homicidal Thoughts:  No  Memory:  Immediate;   Fair Recent;   Fair Remote;   Fair  Judgement:  Fair  Insight:  Fair  Psychomotor Activity:  Normal  Concentration:  Concentration: Fair and Attention Span: Fair  Recall:  [employer] of Knowledge: Fair  Language: Fair  Akathisia:  No  Handed:  Right  AIMS (if indicated): not done  Assets:  Communication Skills Desire for Improvement Housing Social Support Transportation  ADL's:  Intact  Cognition: WNL  Sleep:  Improving   Screenings: Geneticist, molecular Office Visit from 07/08/2022 in [facility] Office Visit from 03/09/2022 in [facility] Office Visit from 08/14/2021 in [facility] Office Visit from 05/23/2021 in [facility] Office Visit from 11/26/2020 in [facility]  AIMS Total Score 0 0 0 0 0      GAD-7    Flowsheet Row Office Visit from 07/08/2022 in [facility] Office Visit from 05/08/2022 in [facility] Office Visit from 03/09/2022 in [facility] Office Visit from 02/11/2022  in [facility] Office Visit from 11/05/2021 in [facility]  Total GAD-7 Score 0 1 2 6  0      PHQ2-9    Flowsheet Row Video Visit from 12/02/2022 in [facility] Office Visit from 07/08/2022 in [facility] Office Visit from 05/08/2022 in [facility] Office Visit from 03/09/2022 in [facility] Office Visit from 02/11/2022 in [facility]  PHQ-2 Total Score 0 0 0 3 1  PHQ-9 Total Score -- -- 1 6 7       Flowsheet Row Video Visit from 06/24/2023 in [facility] Office Visit from 05/11/2023 in [facility] Video Visit from 12/02/2022 in [facility]  C-SSRS RISK CATEGORY Moderate Risk Moderate Risk No Risk        Assessment and Plan: Jamie Baxter is a 73 year old Caucasian female who has a history of MDD, bereavement, anxiety was evaluated by telemedicine today.  Discussed assessment and plan as noted below.  Major depressive disorder in partial remission Bereavement-improving Currently reports grief reaction to the death of her sister has improved.  The higher dosage of fluoxetine  has definitely helped with depression symptoms. - Continue Fluoxetine  80 mg daily - Continue Lamotrigine  150 mg twice daily - Continue Seroquel  50 mg at bedtime  Panic  disorder-improving Currently denies any significant panic attacks.  Does have hydroxyzine  available which is used as needed.  Clonazepam  does help when used for emergencies. - Continue Hydroxyzine  25 mg take 1 tablet daily as needed 1-3 times a week only. - Continue Clonazepam  0.25 to 0.5 mg as needed for anxiety attacks.  Insomnia-improving Currently  reports managing sleep problems better than before. - Continue Trazodone  100-200 mg at bedtime as needed - Could also use Hydroxyzine  as needed. - Continue sleep hygiene techniques  Follow-up in clinic in 3 months or sooner if needed.  Consent: Patient/Guardian gives verbal consent for treatment and assignment of benefits for services provided during this visit. Patient/Guardian expressed understanding and agreed to proceed.   This note was generated in part or whole with voice recognition software. Voice recognition is usually quite accurate but there are transcription errors that can and very often do occur. I apologize for any typographical errors that were not detected and corrected.    Cleofas Hudgins, MD 06/25/2023, 7:54 AM

## 2023-07-02 DIAGNOSIS — S43421A Sprain of right rotator cuff capsule, initial encounter: Secondary | ICD-10-CM | POA: Diagnosis not present

## 2023-07-02 DIAGNOSIS — M25511 Pain in right shoulder: Secondary | ICD-10-CM | POA: Diagnosis not present

## 2023-08-02 DIAGNOSIS — H00019 Hordeolum externum unspecified eye, unspecified eyelid: Secondary | ICD-10-CM | POA: Diagnosis not present

## 2023-08-02 DIAGNOSIS — H6991 Unspecified Eustachian tube disorder, right ear: Secondary | ICD-10-CM | POA: Diagnosis not present

## 2023-08-09 ENCOUNTER — Other Ambulatory Visit: Payer: Self-pay | Admitting: Family Medicine

## 2023-08-09 DIAGNOSIS — H6991 Unspecified Eustachian tube disorder, right ear: Secondary | ICD-10-CM | POA: Diagnosis not present

## 2023-08-09 DIAGNOSIS — Z1231 Encounter for screening mammogram for malignant neoplasm of breast: Secondary | ICD-10-CM

## 2023-08-09 DIAGNOSIS — Z9181 History of falling: Secondary | ICD-10-CM | POA: Diagnosis not present

## 2023-08-09 DIAGNOSIS — Z139 Encounter for screening, unspecified: Secondary | ICD-10-CM | POA: Diagnosis not present

## 2023-08-16 ENCOUNTER — Telehealth: Payer: Self-pay

## 2023-08-16 DIAGNOSIS — F410 Panic disorder [episodic paroxysmal anxiety] without agoraphobia: Secondary | ICD-10-CM

## 2023-08-16 DIAGNOSIS — G4701 Insomnia due to medical condition: Secondary | ICD-10-CM

## 2023-08-16 DIAGNOSIS — F331 Major depressive disorder, recurrent, moderate: Secondary | ICD-10-CM

## 2023-08-16 MED ORDER — FLUOXETINE HCL 40 MG PO CAPS
80.0000 mg | ORAL_CAPSULE | Freq: Every day | ORAL | 3 refills | Status: AC
Start: 2023-08-16 — End: 2024-08-15

## 2023-08-16 MED ORDER — TRAZODONE HCL 100 MG PO TABS: ORAL_TABLET | ORAL | 3 refills | Status: AC

## 2023-08-16 NOTE — Telephone Encounter (Signed)
 spoke with Jamie Baxter and he canceled the remaining refills on both medications. the trazondone was last filled on 5-7 for 90 days. and the fluoxetine  was last fill on 4-11 for 90 days

## 2023-08-16 NOTE — Telephone Encounter (Signed)
 Will defer this to dr. Eappen

## 2023-08-16 NOTE — Telephone Encounter (Signed)
 I have sent trazodone  and fluoxetine  to [facility] as requested.

## 2023-08-16 NOTE — Telephone Encounter (Signed)
 pt called states that her insurance changed and now she has to use [facility] and not [facility]. pt states she needs refills on the fluoxetine  and the trazodone . pt was last seen on 5-22 next appt 8-29

## 2023-08-17 NOTE — Telephone Encounter (Signed)
 Pt.notified

## 2023-10-01 ENCOUNTER — Encounter: Payer: Self-pay | Admitting: Psychiatry

## 2023-10-01 ENCOUNTER — Telehealth: Admitting: Psychiatry

## 2023-10-01 DIAGNOSIS — F3342 Major depressive disorder, recurrent, in full remission: Secondary | ICD-10-CM

## 2023-10-01 DIAGNOSIS — F410 Panic disorder [episodic paroxysmal anxiety] without agoraphobia: Secondary | ICD-10-CM | POA: Diagnosis not present

## 2023-10-01 DIAGNOSIS — G4701 Insomnia due to medical condition: Secondary | ICD-10-CM | POA: Diagnosis not present

## 2023-10-01 DIAGNOSIS — Z634 Disappearance and death of family member: Secondary | ICD-10-CM | POA: Diagnosis not present

## 2023-10-01 NOTE — Progress Notes (Signed)
 Virtual Visit via Video Note  I connected with Jamie Baxter on 10/01/23 at 10:40 AM EDT by a video enabled telemedicine application and verified that I am speaking with the correct person using two identifiers.  Location Provider Location : [facility] Patient Location : Car  Participants: Patient , Provider    I discussed the limitations of evaluation and management by telemedicine and the availability of in person appointments. The patient expressed understanding and agreed to proceed.  I discussed the assessment and treatment plan with the patient. The patient was provided an opportunity to ask questions and all were answered. The patient agreed with the plan and demonstrated an understanding of the instructions.   The patient was advised to call back or seek an in-person evaluation if the symptoms worsen or if the condition fails to improve as anticipated.   BH MD OP Progress Note  10/01/2023 1:52 PM Jamie Baxter  MRN:  [id number]  Chief Complaint:  Chief Complaint  Patient presents with   Follow-up   Depression   Anxiety   Medication Refill   Discussed the use of AI scribe software for clinical note transcription with the patient, who gave verbal consent to proceed.  History of Present Illness Jamie Baxter is a 73 year old Caucasian female, married, retired, lives in [place], has a history of depression, panic disorder, osteoarthritis, history of thyroid  cancer, GERD was evaluated by telemedicine today.  At the beginning of July, she experienced a period of feeling down in a slump that lasted for parts of 11 days, with the most significant symptoms occurring daily for 4 days and then intermittently for about 7 more days. During this time, she describes limited concentration,felt tired, though she remained alert. She denies feeling anxious, scared, or having suicidal thoughts during this episode. She notes that these symptoms resolved, and she has since been feeling very good  overall.  Daily activities such as helping her husband build a deck, gardening, cleaning, cooking, and sewing contribute positively to her mood. She states that spending time outside has had a beneficial impact. She reports sleeping well and taking full advantage of her improved mood and energy.  Her current medication regimen includes quetiapine  50 mg daily, lamotrigine  150 mg twice daily, fluoxetine  80 mg daily (as two 40 mg tablets each morning), and trazodone  for sleep. She typically takes 1.5 tablets of trazodone  at night, as 2 tablets make her feel overly sedated the next morning and 1 tablet is insufficient. She also has hydroxyzine  25 mg as needed for panic, which she has not needed since her last visit. She has clonazepam  available from a previous prescription but has not used it.  She is coping with her grief from the loss of her sister better than before.  She currently denies any suicidality, homicidality or perceptual disturbances.  She was recently diagnosed with seasonal allergies and was started on nasal spray which she takes daily as needed.  She wonders if her cognitive symptoms and low energy and lethargy that happened the initial part of July could also have been related to seasonal allergy symptoms.  She denies any other concerns today.  Visit Diagnosis:    ICD-10-CM   1. MDD (major depressive disorder), recurrent, in full remission (HCC)  F33.42     2. Panic disorder  F41.0     3. Insomnia due to medical condition  G47.01    Mood symptoms, pain    4. Bereavement  Z63.4       Past  Psychiatric History: I have reviewed past psychiatric history from progress note on 08/11/2018.  Past Medical History:  Past Medical History:  Diagnosis Date   Anxiety    Depression    GERD (gastroesophageal reflux disease)    Motion sickness    cars - [place]   Thyroid  disease     Past Surgical History:  Procedure Laterality Date   ABSCESS DRAINAGE     BREAST BIOPSY      BREAST SURGERY     breast biopsy   CATARACT EXTRACTION W/PHACO Left 12/23/2021   Procedure: CATARACT EXTRACTION PHACO AND INTRAOCULAR LENS PLACEMENT (IOC) LEFT  4.14  00:36.2;  Surgeon: Jaye Fallow, MD;  Location: [facility];  Service: Ophthalmology;  Laterality: Left;   CATARACT EXTRACTION W/PHACO Right 01/06/2022   Procedure: CATARACT EXTRACTION PHACO AND INTRAOCULAR LENS PLACEMENT (IOC) RIGHT  5.02  00:35.1;  Surgeon: Jaye Fallow, MD;  Location: [facility];  Service: Ophthalmology;  Laterality: Right;   COLONOSCOPY  2006, 07/17/10   Normal (except melanosis), ? hx polyps in 1990's - [place]    rotator cuff     ROTATOR CUFF REPAIR Right 11/25/2021   [facility]   THYROID  LOBECTOMY  1978   goiter   THYROIDECTOMY SUBSTERNAL  2010   left side/cancer/radioactive iodine 2 years in a row for Ca cells   VAGINAL HYSTERECTOMY     fiboid tumors    Family Psychiatric History: I have reviewed family psychiatric history from progress note on 08/11/2018.  Family History:  Family History  Problem Relation Age of Onset   Hypertension Mother    GER disease Father    Rheum arthritis Sister    Healthy Sister    Mental illness Neg Hx     Social History: I have reviewed social history from progress note on 08/11/2018. Social History   Socioeconomic History   Marital status: Married    Spouse name: monroe   Number of children: 1   Years of education: Not on file   Highest education level: High school graduate  Occupational History   Not on file  Tobacco Use   Smoking status: Former    Current packs/day: 0.00    Types: Cigarettes    Quit date: 02/03/1976    Years since quitting: 47.6   Smokeless tobacco: Never  Vaping Use   Vaping status: Never Used  Substance and Sexual Activity   Alcohol use: No   Drug use: No   Sexual activity: Not Currently  Other Topics Concern   Not on file  Social History Narrative   Not on file   Social Drivers of Health    Financial Resource Strain: Low Risk  (08/11/2018)   Overall Financial Resource Strain (CARDIA)    Difficulty of Paying Living Expenses: Not hard at all  Recent Concern: Financial Resource Strain - High Risk (07/12/2018)   Overall Financial Resource Strain (CARDIA)    Difficulty of Paying Living Expenses: Very hard  Food Insecurity: No Food Insecurity (08/11/2018)   Hunger Vital Sign    Worried About Running Out of Food in the Last Year: Never true    Ran Out of Food in the Last Year: Never true  Recent Concern: Food Insecurity - Food Insecurity Present (07/12/2018)   Hunger Vital Sign    Worried About Running Out of Food in the Last Year: Often true    Ran Out of Food in the Last Year: Often true  Transportation Needs: No Transportation Needs (07/12/2018)   PRAPARE -  Administrator, Civil Service (Medical): No    Lack of Transportation (Non-Medical): No  Physical Activity: Insufficiently Active (07/12/2018)   Exercise Vital Sign    Days of Exercise per Week: 5 days    Minutes of Exercise per Session: 20 min  Stress: No Stress Concern Present (07/12/2018)   [employer] of Occupational Health - Occupational Stress Questionnaire    Feeling of Stress : Not at all  Social Connections: Unknown (07/12/2018)   Social Connection and Isolation Panel    Frequency of Communication with Friends and Family: Not on file    Frequency of Social Gatherings with Friends and Family: Not on file    Attends Religious Services: More than 4 times per year    Active Member of [employer] or Organizations: No    Attends Banker Meetings: Never    Marital Status: Married    Allergies:  Allergies  Allergen Reactions   Amantadine Other (See Comments)    disorientation   Benztropine    Geodon  [Ziprasidone Hcl] Other (See Comments)    disorientation   Sulfa Antibiotics Itching and Swelling    Topical only   Z-Pak [Azithromycin]     hallucinations    Metabolic Disorder Labs: Lab  Results  Component Value Date   HGBA1C 5.7 (H) 09/19/2020   MPG 116.89 09/19/2020   Lab Results  Component Value Date   PROLACTIN 15.8 09/19/2020   Lab Results  Component Value Date   CHOL 235 (H) 11/28/2020   TRIG 94 11/28/2020   HDL 63 11/28/2020   CHOLHDL 3.7 11/28/2020   VLDL 19 11/28/2020   LDLCALC 153 (H) 11/28/2020   Lab Results  Component Value Date   TSH 1.601 02/12/2022    Therapeutic Level Labs: No results found for: LITHIUM No results found for: VALPROATE No results found for: CBMZ  Current Medications: Current Outpatient Medications  Medication Sig Dispense Refill   acetaminophen (TYLENOL) 325 MG tablet Take by mouth.     B Complex Vitamins (B COMPLEX PO) Take by mouth.     celecoxib (CELEBREX) 100 MG capsule Take 1 capsule by mouth 2 (two) times daily.     clonazePAM  (KLONOPIN ) 0.5 MG tablet Take 0.5-1 tablets (0.25-0.5 mg total) by mouth daily as needed for anxiety. Must last 30 days 12 tablet 0   Coenzyme Q10 (COQ10 PO) Take by mouth.     FLUoxetine  (PROZAC ) 40 MG capsule Take 2 capsules (80 mg total) by mouth daily. 180 capsule 3   fluticasone (FLONASE) 50 MCG/ACT nasal spray Place into both nostrils daily.     gabapentin (NEURONTIN) 100 MG capsule Take 1 capsule by mouth 2 (two) times daily.     hydrOXYzine  (VISTARIL ) 25 MG capsule TAKE 1 CAP BY MOUTH DAILY AS NEEDED TO BE TAKEN 1-3 TIMES A WEEK AS NEEDED FOR SEVERE PANIC ATTACKS 90 capsule 1   lamoTRIgine  (LAMICTAL ) 150 MG tablet TAKE 1 TABLET TWICE DAILY 180 tablet 3   lidocaine  (LIDODERM ) 5 % SMARTSIG:Topical     MILK THISTLE PO Take by mouth.     Multiple Vitamin (MULTI-VITAMIN) tablet Take 1 tablet by mouth daily.     omeprazole  (PRILOSEC) 40 MG capsule TAKE 1 CAPSULE BY MOUTH EVERY DAY 90 capsule 0   QUEtiapine  (SEROQUEL ) 50 MG tablet Take 1 tablet (50 mg total) by mouth at bedtime. 90 tablet 3   SM CALCIUM 600/VITAMIN D 600-400 MG-UNIT tablet Take 1 tablet by mouth 2 (two) times daily.  SYNTHROID 150 MCG tablet Take 150 mcg by mouth daily.     traZODone  (DESYREL ) 100 MG tablet TAKE 1 AND 1/2 TO 2 TABLETS BY MOUTH AT BEDTIME AS  NEEDED FOR SLEEP 180 tablet 3   triamcinolone  cream (KENALOG) 0.1 % TRIAMCINOLONE  ACETONIDE, 0.1% (External Cream)  1 (one) Cream Cream use as directed for 0 days  Quantity: 30;  Refills: 1   Ordered :12-Jan-2014  Raguel Promise ;  Started 12-Jan-2014 Active     Current Facility-Administered Medications  Medication Dose Route Frequency Provider Last Rate Last Admin   0.9 %  sodium chloride  infusion  500 mL Intravenous Continuous Avram Lupita BRAVO, MD         Musculoskeletal: Strength & Muscle Tone: UTA Gait & Station: Seated Patient leans: N/A  Psychiatric Specialty Exam: Review of Systems  Psychiatric/Behavioral: Negative.      There were no vitals taken for this visit.There is no height or weight on file to calculate BMI.  General Appearance: Fairly Groomed  Eye Contact:  Fair  Speech:  Normal Rate  Volume:  Normal  Mood:  Euthymic  Affect:  Congruent  Thought Process:  Goal Directed and Descriptions of Associations: Intact  Orientation:  Full (Time, Place, and Person)  Thought Content: Logical   Suicidal Thoughts:  No  Homicidal Thoughts:  No  Memory:  Immediate;   Fair Recent;   Fair Remote;   Fair  Judgement:  Fair  Insight:  Fair  Psychomotor Activity:  Normal  Concentration:  Concentration: Fair and Attention Span: Fair  Recall:  [employer] of Knowledge: Fair  Language: Fair  Akathisia:  No  Handed:  Right  AIMS (if indicated): not done  Assets:  Manufacturing systems engineer Desire for Improvement Housing Social Support Transportation  ADL's:  Intact  Cognition: WNL  Sleep:  Fair   Screenings: Midwife Visit from 07/08/2022 in [facility] Office Visit from 03/09/2022 in [facility] Office Visit from 08/14/2021 in [facility] Office Visit from 05/23/2021 in [facility] Office Visit from 11/26/2020 in [facility]  AIMS Total Score 0 0 0 0 0   GAD-7    Flowsheet Row Office Visit from 07/08/2022 in [facility] Office Visit from 05/08/2022 in [facility] Office Visit from 03/09/2022 in [facility] Office Visit from 02/11/2022 in [facility] Office Visit from 11/05/2021 in [facility]  Total GAD-7 Score 0 1 2 6  0   PHQ2-9    Flowsheet Row Video Visit from 12/02/2022 in [facility] Office Visit from 07/08/2022 in [facility] Office Visit from 05/08/2022 in [facility] Office Visit from 03/09/2022 in [facility] Office Visit from 02/11/2022 in [facility]  PHQ-2 Total Score 0 0 0 3 1  PHQ-9 Total Score -- -- 1 6 7    Flowsheet Row Video Visit from 10/01/2023 in [facility] Video Visit from 06/24/2023 in [facility] Office Visit from 05/11/2023 in [facility]  C-SSRS RISK CATEGORY Moderate Risk Moderate Risk Moderate Risk     Assessment and Plan: RAMA SORCI is a  73 year old Caucasian female who has a history of depression, bereavement, anxiety was evaluated by telemedicine today.  Discussed assessment and plan as noted below.  MDD in remission Bereavement-improving Currently reports grief as better coping better with the loss of her sister.  Denies any significant depression symptoms and  reports sleep is overall good. Continue Fluoxetine  80 mg daily Continue Lamotrigine  150 mg twice daily Continue Seroquel  50 mg at bedtime  Panic disorder-improving Currently denies any significant panic attacks.  Although she does have medications like hydroxyzine  and clonazepam  available to use as needed, she denies using it recently. Continue Hydroxyzine  25 mg take 1 tablet daily as needed. Continue Clonazepam  0.25-0.5 mg as needed for anxiety attacks.  Limiting use.  Insomnia-stable Currently reports sleep is overall good. Continue Trazodone  100-200 mg at bedtime as needed. Continue sleep hygiene techniques.  Will recommend to repeating labs including lipid panel, hemoglobin A1c, prolactin, sodium level, platelet count.  If unable to get this completed at her primary provider's office will consider ordering it at her next visit.  Follow-up Follow-up in clinic in 4 to 6 months or sooner in person.  Consent: Patient/Guardian gives verbal consent for treatment and assignment of benefits for services provided during this visit. Patient/Guardian expressed understanding and agreed to proceed.   This note was generated in part or whole with voice recognition software. Voice recognition is usually quite accurate but there are transcription errors that can and very often do occur. I apologize for any typographical errors that were not detected and corrected.     Analicia Skibinski, MD 10/01/2023, 1:52 PM

## 2023-10-26 DIAGNOSIS — M150 Primary generalized (osteo)arthritis: Secondary | ICD-10-CM | POA: Diagnosis not present

## 2023-10-26 DIAGNOSIS — Z79899 Other long term (current) drug therapy: Secondary | ICD-10-CM | POA: Diagnosis not present

## 2023-10-26 DIAGNOSIS — M47816 Spondylosis without myelopathy or radiculopathy, lumbar region: Secondary | ICD-10-CM | POA: Diagnosis not present

## 2023-10-26 LAB — LAB REPORT - SCANNED: EGFR: 68

## 2023-11-01 ENCOUNTER — Telehealth: Payer: Self-pay | Admitting: Psychiatry

## 2023-11-01 DIAGNOSIS — M47816 Spondylosis without myelopathy or radiculopathy, lumbar region: Secondary | ICD-10-CM | POA: Diagnosis not present

## 2023-11-01 NOTE — Telephone Encounter (Signed)
 I have reviewed labs received from [facility]-10/26/2023 CBC with differential within normal limits except for [facility] elevated at 31.6, MPV at 8.9 and monocyte count 0.77. Sodium within normal limits at 137. Calcium low at 8.4 AST/ALT-within normal limits.

## 2023-11-04 DIAGNOSIS — M47816 Spondylosis without myelopathy or radiculopathy, lumbar region: Secondary | ICD-10-CM | POA: Diagnosis not present

## 2023-11-09 DIAGNOSIS — M47816 Spondylosis without myelopathy or radiculopathy, lumbar region: Secondary | ICD-10-CM | POA: Diagnosis not present

## 2023-11-11 DIAGNOSIS — M47816 Spondylosis without myelopathy or radiculopathy, lumbar region: Secondary | ICD-10-CM | POA: Diagnosis not present

## 2023-11-19 DIAGNOSIS — M47816 Spondylosis without myelopathy or radiculopathy, lumbar region: Secondary | ICD-10-CM | POA: Diagnosis not present

## 2023-11-22 DIAGNOSIS — M47816 Spondylosis without myelopathy or radiculopathy, lumbar region: Secondary | ICD-10-CM | POA: Diagnosis not present

## 2023-11-26 DIAGNOSIS — M47816 Spondylosis without myelopathy or radiculopathy, lumbar region: Secondary | ICD-10-CM | POA: Diagnosis not present

## 2023-11-29 DIAGNOSIS — M47816 Spondylosis without myelopathy or radiculopathy, lumbar region: Secondary | ICD-10-CM | POA: Diagnosis not present

## 2023-12-02 DIAGNOSIS — M47816 Spondylosis without myelopathy or radiculopathy, lumbar region: Secondary | ICD-10-CM | POA: Diagnosis not present

## 2024-02-14 ENCOUNTER — Telehealth: Payer: Self-pay

## 2024-02-14 NOTE — Telephone Encounter (Signed)
Patient is not due for refill

## 2024-02-14 NOTE — Telephone Encounter (Signed)
 Received fax requesting a refill of the  QUEtiapine  (SEROQUEL ) 50 MG tablet  Last visit 09-11-23 Next visit 03-07-24    Preferred Pharmacies   [id number] [place], [place] - [address] AT [facility] Phone: [phone]  Fax: [phone]

## 2024-02-21 ENCOUNTER — Other Ambulatory Visit: Payer: Self-pay | Admitting: Psychiatry

## 2024-02-21 DIAGNOSIS — F410 Panic disorder [episodic paroxysmal anxiety] without agoraphobia: Secondary | ICD-10-CM

## 2024-02-21 DIAGNOSIS — Z634 Disappearance and death of family member: Secondary | ICD-10-CM

## 2024-03-07 ENCOUNTER — Ambulatory Visit: Admitting: Psychiatry

## 2024-03-08 ENCOUNTER — Ambulatory Visit: Admitting: Psychiatry

## 2024-05-02 ENCOUNTER — Ambulatory Visit: Admitting: Psychiatry
